# Patient Record
Sex: Male | Born: 1950 | Race: White | Hispanic: No | Marital: Married | Smoking: Current some day smoker
Health system: Southern US, Community
[De-identification: ages and names within clinical notes are randomized; demographics above are authoritative.]

## PROBLEM LIST (undated history)

## (undated) DIAGNOSIS — I519 Heart disease, unspecified: Secondary | ICD-10-CM

## (undated) DIAGNOSIS — Z955 Presence of coronary angioplasty implant and graft: Secondary | ICD-10-CM

## (undated) DIAGNOSIS — K635 Polyp of colon: Secondary | ICD-10-CM

## (undated) DIAGNOSIS — E785 Hyperlipidemia, unspecified: Secondary | ICD-10-CM

## (undated) HISTORY — DX: Polyp of colon: K63.5

## (undated) HISTORY — DX: Hyperlipidemia, unspecified: E78.5

## (undated) HISTORY — PX: COLONOSCOPY: SHX174

## (undated) HISTORY — DX: Heart disease, unspecified: I51.9

## (undated) HISTORY — PX: POLYPECTOMY: SHX149

---

## 1999-10-27 HISTORY — PX: SPINE SURGERY: SHX786

## 2000-06-15 ENCOUNTER — Encounter: Admission: RE | Admit: 2000-06-15 | Discharge: 2000-06-15 | Payer: Self-pay | Admitting: Orthopedic Surgery

## 2000-06-15 ENCOUNTER — Encounter: Payer: Self-pay | Admitting: Orthopedic Surgery

## 2000-06-30 ENCOUNTER — Encounter: Admission: RE | Admit: 2000-06-30 | Discharge: 2000-07-26 | Payer: Self-pay | Admitting: Neurology

## 2000-07-28 ENCOUNTER — Encounter: Admission: RE | Admit: 2000-07-28 | Discharge: 2000-07-28 | Payer: Self-pay | Admitting: Neurology

## 2000-07-28 ENCOUNTER — Encounter: Payer: Self-pay | Admitting: Neurology

## 2000-08-05 ENCOUNTER — Ambulatory Visit (HOSPITAL_COMMUNITY): Admission: RE | Admit: 2000-08-05 | Discharge: 2000-08-05 | Payer: Self-pay | Admitting: Neurological Surgery

## 2000-08-05 ENCOUNTER — Encounter: Payer: Self-pay | Admitting: Neurological Surgery

## 2002-10-26 HISTORY — PX: HERNIA REPAIR: SHX51

## 2002-10-26 HISTORY — PX: OTHER SURGICAL HISTORY: SHX169

## 2002-11-03 ENCOUNTER — Ambulatory Visit (HOSPITAL_COMMUNITY): Admission: RE | Admit: 2002-11-03 | Discharge: 2002-11-04 | Payer: Self-pay | Admitting: Cardiology

## 2002-11-03 ENCOUNTER — Encounter: Payer: Self-pay | Admitting: Cardiology

## 2003-05-02 ENCOUNTER — Encounter: Admission: RE | Admit: 2003-05-02 | Discharge: 2003-05-02 | Payer: Self-pay | Admitting: General Surgery

## 2003-05-02 ENCOUNTER — Ambulatory Visit (HOSPITAL_BASED_OUTPATIENT_CLINIC_OR_DEPARTMENT_OTHER): Admission: RE | Admit: 2003-05-02 | Discharge: 2003-05-02 | Payer: Self-pay | Admitting: General Surgery

## 2003-05-02 ENCOUNTER — Encounter: Payer: Self-pay | Admitting: General Surgery

## 2005-02-16 ENCOUNTER — Ambulatory Visit: Payer: Self-pay | Admitting: Cardiology

## 2005-08-20 ENCOUNTER — Ambulatory Visit: Payer: Self-pay | Admitting: Internal Medicine

## 2005-10-15 ENCOUNTER — Ambulatory Visit: Payer: Self-pay | Admitting: Internal Medicine

## 2006-02-10 ENCOUNTER — Ambulatory Visit: Payer: Self-pay | Admitting: Cardiology

## 2006-03-04 ENCOUNTER — Ambulatory Visit: Payer: Self-pay

## 2007-06-15 ENCOUNTER — Ambulatory Visit: Payer: Self-pay | Admitting: Cardiology

## 2007-06-15 LAB — CONVERTED CEMR LAB
ALT: 25 units/L (ref 0–53)
AST: 22 units/L (ref 0–37)
Albumin: 3.4 g/dL — ABNORMAL LOW (ref 3.5–5.2)
Alkaline Phosphatase: 52 units/L (ref 39–117)
Bilirubin, Direct: 0.2 mg/dL (ref 0.0–0.3)
Cholesterol: 117 mg/dL (ref 0–200)
HDL: 37.3 mg/dL — ABNORMAL LOW (ref >39.0)
LDL Cholesterol: 73 mg/dL (ref 0–99)
Total Bilirubin: 1.1 mg/dL (ref 0.3–1.2)
Total CHOL/HDL Ratio: 3.1
Total Protein: 6.4 g/dL (ref 6.0–8.3)
Triglycerides: 35 mg/dL (ref 0–149)
VLDL: 7 mg/dL (ref 0–40)

## 2007-07-05 ENCOUNTER — Ambulatory Visit: Payer: Self-pay | Admitting: Cardiology

## 2007-09-08 ENCOUNTER — Ambulatory Visit: Payer: Self-pay | Admitting: Cardiology

## 2007-09-08 LAB — CONVERTED CEMR LAB
ALT: 23 units/L (ref 0–53)
AST: 22 units/L (ref 0–37)
Albumin: 3.6 g/dL (ref 3.5–5.2)
Alkaline Phosphatase: 54 units/L (ref 39–117)
Bilirubin, Direct: 0.2 mg/dL (ref 0.0–0.3)
Cholesterol: 123 mg/dL (ref 0–200)
HDL: 39.7 mg/dL (ref >39.0)
LDL Cholesterol: 76 mg/dL (ref 0–99)
Total Bilirubin: 1.2 mg/dL (ref 0.3–1.2)
Total CHOL/HDL Ratio: 3.1
Total Protein: 6.8 g/dL (ref 6.0–8.3)
Triglycerides: 37 mg/dL (ref 0–149)
VLDL: 7 mg/dL (ref 0–40)

## 2008-01-16 ENCOUNTER — Ambulatory Visit: Payer: Self-pay | Admitting: Cardiology

## 2008-01-16 LAB — CONVERTED CEMR LAB
ALT: 21 units/L (ref 0–53)
AST: 22 units/L (ref 0–37)
Albumin: 3.4 g/dL — ABNORMAL LOW (ref 3.5–5.2)
Alkaline Phosphatase: 45 units/L (ref 39–117)
Bilirubin, Direct: 0.2 mg/dL (ref 0.0–0.3)
Cholesterol: 127 mg/dL (ref 0–200)
HDL: 37.1 mg/dL — ABNORMAL LOW (ref >39.0)
LDL Cholesterol: 81 mg/dL (ref 0–99)
Total Bilirubin: 1 mg/dL (ref 0.3–1.2)
Total CHOL/HDL Ratio: 3.4
Total Protein: 6.2 g/dL (ref 6.0–8.3)
Triglycerides: 43 mg/dL (ref 0–149)
VLDL: 9 mg/dL (ref 0–40)

## 2008-01-24 ENCOUNTER — Ambulatory Visit: Payer: Self-pay | Admitting: Cardiology

## 2008-01-26 ENCOUNTER — Ambulatory Visit: Payer: Self-pay | Admitting: Cardiology

## 2008-11-13 ENCOUNTER — Ambulatory Visit: Payer: Self-pay | Admitting: Cardiology

## 2009-02-04 ENCOUNTER — Ambulatory Visit: Payer: Self-pay | Admitting: Cardiology

## 2009-02-22 ENCOUNTER — Telehealth: Payer: Self-pay | Admitting: Cardiology

## 2009-02-22 LAB — CONVERTED CEMR LAB
AST: 28 units/L (ref 0–37)
Alkaline Phosphatase: 52 units/L (ref 39–117)
Bilirubin, Direct: 0.2 mg/dL (ref 0.0–0.3)
Total Bilirubin: 1.5 mg/dL — ABNORMAL HIGH (ref 0.3–1.2)
Total CHOL/HDL Ratio: 3
Triglycerides: 39 mg/dL (ref 0.0–149.0)

## 2009-06-05 ENCOUNTER — Encounter (INDEPENDENT_AMBULATORY_CARE_PROVIDER_SITE_OTHER): Payer: Self-pay | Admitting: *Deleted

## 2009-07-03 ENCOUNTER — Telehealth: Payer: Self-pay | Admitting: Cardiology

## 2009-07-04 DIAGNOSIS — E7800 Pure hypercholesterolemia, unspecified: Secondary | ICD-10-CM | POA: Insufficient documentation

## 2009-08-27 DIAGNOSIS — I2510 Atherosclerotic heart disease of native coronary artery without angina pectoris: Secondary | ICD-10-CM | POA: Insufficient documentation

## 2009-08-28 ENCOUNTER — Ambulatory Visit: Payer: Self-pay | Admitting: Cardiology

## 2009-08-28 ENCOUNTER — Ambulatory Visit: Payer: Self-pay

## 2009-11-15 ENCOUNTER — Telehealth: Payer: Self-pay | Admitting: Gastroenterology

## 2009-11-15 ENCOUNTER — Encounter: Payer: Self-pay | Admitting: Gastroenterology

## 2009-11-22 ENCOUNTER — Encounter (INDEPENDENT_AMBULATORY_CARE_PROVIDER_SITE_OTHER): Payer: Self-pay | Admitting: *Deleted

## 2009-11-25 ENCOUNTER — Ambulatory Visit: Payer: Self-pay | Admitting: Gastroenterology

## 2009-11-26 ENCOUNTER — Telehealth: Payer: Self-pay | Admitting: Gastroenterology

## 2010-02-17 ENCOUNTER — Ambulatory Visit: Payer: Self-pay | Admitting: Cardiology

## 2010-02-19 LAB — CONVERTED CEMR LAB
Bilirubin, Direct: 0.2 mg/dL (ref 0.0–0.3)
LDL Cholesterol: 72 mg/dL (ref 0–99)
Total Bilirubin: 1.1 mg/dL (ref 0.3–1.2)
Total CHOL/HDL Ratio: 3
VLDL: 14.6 mg/dL (ref 0.0–40.0)

## 2010-09-25 ENCOUNTER — Telehealth: Payer: Self-pay | Admitting: Cardiology

## 2010-11-25 NOTE — Letter (Signed)
Summary: Previsit letter  [facility]  [address] [place], [place] [address]   Phone: [phone]  Fax: [phone]       11/15/2009 MRN: [id number]  Jordan [name] [address] [place], [place]  [address]  Dear Mr. Jordan [name],  Welcome to the Gastroenterology Division at [facility].    You are scheduled to see a nurse for your pre-procedure visit on 11/25/09 at 8:30 a.m. on the 3rd floor at [facility], [address]  We ask that you try to arrive at our office 15 minutes prior to your appointment time to allow for check-in.  Your nurse visit will consist of discussing your medical and surgical history, your immediate family medical history, and your medications.    Please bring a complete list of all your medications or, if you prefer, bring the medication bottles and we will list them.  We will need to be aware of both prescribed and over the counter drugs.  We will need to know exact dosage information as well.  If you are on blood thinners (Coumadin, Plavix, Aggrenox, Ticlid, etc.) please call our office today/prior to your appointment, as we need to consult with your physician about holding your medication.   Please be prepared to read and sign documents such as consent forms, a financial agreement, and acknowledgement forms.  If necessary, and with your consent, a friend or relative is welcome to sit-in on the nurse visit with you.  Please bring your insurance card so that we may make a copy of it.  If your insurance requires a referral to see a specialist, please bring your referral form from your primary care physician.  No co-pay is required for this nurse visit.     If you cannot keep your appointment, please call [phone] to cancel or reschedule prior to your appointment date.  This allows [place] the opportunity to schedule an appointment for another patient in need of care.    Thank you for choosing [facility] for your medical  needs.  We appreciate the opportunity to care for you.  Please visit [place] at our website  to learn more about our practice.                     Sincerely.                                                                                                                   The Gastroenterology Division

## 2010-11-25 NOTE — Progress Notes (Signed)
Summary: refill meds  Phone Note Refill Request Call back at Home Phone [phone] Message from:  Patient on September 25, 2010 1:14 PM  Refills Requested: Medication #1:  CRESTOR 40 MG TABS Take 1 tablet by mouth [facility]time [facility] [address]    Method Requested: Fax to Local Pharmacy Initial call taken by: Lorne Skeens,  September 25, 2010 1:14 PM  Follow-up for Phone Call        Rx faxed to pharmacy. Vikki Ports  September 25, 2010 2:24 PM     Prescriptions: CRESTOR 40 MG TABS (ROSUVASTATIN CALCIUM) Take 1 tablet by mouth at bedtime  #90 x 0   Entered by:   Vikki Ports   Authorized by:   Ronaldo Miyamoto, MD, [facility]   Signed by:   Vikki Ports on 09/25/2010   Method used:   [facility] ...       [facility] [phone]* (retail)       [address]       [place], [place]  [address]       Ph: [phone] or [phone]       Fax: [phone]   RxID:   [id number]

## 2010-11-25 NOTE — Progress Notes (Signed)
Summary: [facility]  Phone Note Outgoing Call   Call placed by: Hortense Ramal CMA Duncan Dull),  November 15, 2009 12:03 PM Call placed to: Patient Summary of Call: Called patient to advise him that it is time for his recall colonoscopy. He has scheduled a date and time for both a previsit and colonoscopy. Initial call taken by: Hortense Ramal CMA Duncan Dull),  November 15, 2009 12:04 PM

## 2010-11-25 NOTE — Letter (Signed)
Summary: [facility] Instructions  [facility]  [address] [place], [place] [address]   Phone: [phone]  Fax: [phone]       Jordan [name]    [birth year]    MRN: [id number]        Procedure Day /Date:  Tuesday 12/03/09     Arrival Time:  3:00pm     Procedure Time:  4:00pm     Location of Procedure:                    _X _  [facility] (4th Floor)                        PREPARATION FOR COLONOSCOPY WITH MOVIPREP   Starting 5 days prior to your procedure   Thursday 02/03   do not eat nuts, seeds, popcorn, corn, beans, peas,  salads, or any raw vegetables.  Do not take any fiber supplements (e.g. Metamucil, Citrucel, and Benefiber).  THE DAY BEFORE YOUR PROCEDURE         DATE:  02/07   DAY: Monday  1.  Drink clear liquids the entire day-NO SOLID FOOD  2.  Do not drink anything colored red or purple.  Avoid juices with pulp.  No orange juice.  3.  Drink at least 64 oz. (8 glasses) of fluid/clear liquids during the day to prevent dehydration and help the prep work efficiently.  CLEAR LIQUIDS INCLUDE: Water Jello Ice Popsicles Tea (sugar ok, no milk/cream) Powdered fruit flavored drinks Coffee (sugar ok, no milk/cream) Gatorade Juice: apple, white grape, white cranberry  Lemonade Clear bullion, consomm, broth Carbonated beverages (any kind) Strained chicken noodle soup Hard Candy                             4.  In the morning, mix first dose of MoviPrep solution:    Empty 1 Pouch A and 1 Pouch B into the disposable container    Add lukewarm drinking water to the top line of the container. Mix to dissolve    Refrigerate (mixed solution should be used within 24 hrs)  5.  Begin drinking the prep at 5:00 p.m. The MoviPrep container is divided by 4 marks.   Every 15 minutes drink the solution down to the next mark (approximately 8 oz) until the full liter is complete.   6.  Follow completed prep with 16 oz of clear liquid of your  choice (Nothing red or purple).  Continue to drink clear liquids until bedtime.  7.  Before going to bed, mix second dose of MoviPrep solution:    Empty 1 Pouch A and 1 Pouch B into the disposable container    Add lukewarm drinking water to the top line of the container. Mix to dissolve    Refrigerate  THE DAY OF YOUR PROCEDURE      DATE:  02/08  DAY:  Tuesday  Beginning at  11:00 a.m. (5 hours before procedure):         1. Every 15 minutes, drink the solution down to the next mark (approx 8 oz) until the full liter is complete.  2. Follow completed prep with 16 oz. of clear liquid of your choice.    3. You may drink clear liquids until  2:00pm  (2 HOURS BEFORE PROCEDURE).   MEDICATION INSTRUCTIONS  Unless otherwise instructed, you should take regular prescription medications with a  small sip of water   as early as possible the morning of your procedure.           OTHER INSTRUCTIONS  You will need a responsible adult at least 60 years of age to accompany you and drive you home.   This person must remain in the waiting room during your procedure.  Wear loose fitting clothing that is easily removed.  Leave jewelry and other valuables at home.  However, you may wish to bring a book to read or  an iPod/MP3 player to listen to music as you wait for your procedure to start.  Remove all body piercing jewelry and leave at home.  Total time from sign-in until discharge is approximately 2-3 hours.  You should go home directly after your procedure and rest.  You can resume normal activities the  day after your procedure.  The day of your procedure you should not:   Drive   Make legal decisions   Operate machinery   Drink alcohol   Return to work  You will receive specific instructions about eating, activities and medications before you leave.    The above instructions have been reviewed and explained to me by   Wyona Almas RN  November 25, 2009 8:53  AM     I fully understand and can verbalize these instructions _____________________________ Date _________

## 2010-11-25 NOTE — Miscellaneous (Signed)
Summary: [facility] Previsit/prep  Clinical Lists Changes  Medications: Added new medication of MOVIPREP 100 GM  SOLR (PEG-KCL-NACL-NASULF-NA ASC-C) As per prep instructions. - Signed Rx of MOVIPREP 100 GM  SOLR (PEG-KCL-NACL-NASULF-NA ASC-C) As per prep instructions.;  #1 x 0;  Signed;  Entered by: Wyona Almas RN;  Authorized by: Meryl Dare MD [facility];  Method used: [facility] [address] [phone]*, [address] [place], [place], [place]  [address], Ph: [phone] or [phone], Fax: [phone] Observations: Added new observation of NKA: T (11/25/2009 8:24)    Prescriptions: MOVIPREP 100 GM  SOLR (PEG-KCL-NACL-NASULF-NA ASC-C) As per prep instructions.  #1 x 0   Entered by:   Wyona Almas RN   Authorized by:   Meryl Dare MD [facility]   Signed by:   Wyona Almas RN on 11/25/2009   Method used:   Electronically to        [facility] [address] [phone]* (retail)       [address]       [place], [place]  [address]       Ph: [phone] or [phone]       Fax: [phone]   RxID:   [id number]

## 2010-11-25 NOTE — Progress Notes (Signed)
Summary: Colonoscopy  Phone Note Outgoing Call   Call placed by: Ashok Cordia RN,  November 26, 2009 3:41 PM Summary of Call: Pt talked with Dr. Jarold Motto.. Pt is sch with Dr. Russella Dar for a direct colon.  PT would like for D.r Jarold Motto to do his proc.  This is OK with Dr. Jarold Motto.  Pt can be reached at [phone].  Pt has already had his previsit. Initial call taken by: Ashok Cordia RN,  November 26, 2009 3:42 PM  Follow-up for Phone Call        Pt asks about how the colon charges will be done.  If billed as diagnostic then insurance will pay 80% after pt meets a $2500 deductable.  IF billed as screening/prevenative care insurance will pay 100%.  Pt states he had polyps in the past and is asking about how this will be billed.  He wants to find out about this before colon is rescheduled with Dr. Jarold Motto. Follow-up by: Ashok Cordia RN,  November 27, 2009 9:23 AM  Additional Follow-up for Phone Call Additional follow up Details #1::        Talked with pt.  He is going to wait a few months and see if he meets his decuctible before scheduling colon.  Unsure of how [employer] will pay.  Encouraged pt not to delay too long due to history of colon polyps. Additional Follow-up by: Ashok Cordia RN,  November 28, 2009 3:44 PM

## 2011-01-01 ENCOUNTER — Telehealth: Payer: Self-pay | Admitting: Cardiology

## 2011-01-06 NOTE — Progress Notes (Signed)
Summary: refill request Crestor 40mg   Phone Note Refill Request Message from:  Patient on January 01, 2011 8:16 AM  Refills Requested: Medication #1:  CRESTOR 40 MG TABS Take 1 tablet by mouth at bedtime [facility] [address]   Method Requested: Telephone to Pharmacy Initial call taken by: Glynda Jaeger,  January 01, 2011 8:16 AM    Prescriptions: CRESTOR 40 MG TABS (ROSUVASTATIN CALCIUM) Take 1 tablet by mouth at bedtime  #90 x 3   Entered by:   Celestia Khat, CMA   Authorized by:   Ronaldo Miyamoto, MD, [facility]   Signed by:   Celestia Khat, CMA on 01/01/2011   Method used:   Electronically to        [facility] [address] [phone]* (retail)       [address]       [place], [place]  [address]       Ph: [phone] or [phone]       Fax: [phone]   RxID:   [id number]

## 2011-03-10 NOTE — Assessment & Plan Note (Signed)
[facility]                            CARDIOLOGY OFFICE NOTE   NAME:Saunders, Jordan [name]                    MRN:          [id number]  DATE:11/13/2008                            DOB:          11-06-1950    Jordan [name] is in for followup visit.  He really is doing quite well.  He  denies any chest pain.  He walks on the treadmill 3-4 times per day.  His last lipid profile was in April 2009.  He has not had any obvious  side effects from high-dose Crestor.   CURRENT MEDICATIONS:  1. Folic acid 400 mcg daily.  2. Lysine 1 daily.  3. Multivitamin daily.  4. Aspirin 162 mg daily.  5. Crestor 40 mg daily.   PHYSICAL EXAMINATION:  GENERAL:  He is alert and oriented in no  distress.  VITAL SIGNS:  Blood pressure is 128/64, the pulse is 58, and the weight  is 199 pounds.  NECK:  His jugular veins are not distended.  LUNGS:  The lung fields are clear to auscultation and percussion.  CARDIAC:  The PMI is nondisplaced.  There is a normal first and second  heart sound without murmurs, rubs, or gallops.  ABDOMEN:  Soft.  The abdominal aorta is palpable due to thin size, but  importantly it does not feel expanded.  EXTREMITIES:  No edema.   The electrocardiogram demonstrates normal sinus rhythm with sinus  bradycardia, otherwise within normal limits.   IMPRESSION:  1. Coronary artery disease, status post percutaneous stenting of the      left anterior descending artery on November 03, 2002 using a 2.5 x 23      Cypher drug-eluting stent.  2. Hypercholesterolemia, on statin therapy with last LDL cholesterol      of 67.   PLAN:  1. Continue current medical regimen.  2. Lipid and liver profile in the next few months.  3. Exercise tolerance test towards the end of the year.  4. Return to clinic in 1 year.     Arturo Morton. Riley Kill, MD, [facility]  Electronically Signed    TDS/MedQ  DD: 11/13/2008  DT: 11/14/2008  Job #: [id number]

## 2011-03-10 NOTE — Assessment & Plan Note (Signed)
[facility]                            CARDIOLOGY OFFICE NOTE   NAME:Jordan [name], Jordan [name]                    MRN:          [id number]  DATE:07/05/2007                            DOB:          29-Dec-1950    Mr. Esterly is in for a followup visit. In general he is doing well. He  is able to exercise without difficultly. He denies any chest pain or  significant shortness of breath. We had him come in today to review his  lipid profile. His most recent lipid profile reveals an LDL of 73, with  an HDL of 37, and a total cholesterol of 118. This is fairly similar to  his previous study. He currently is on Vytorin 10/40 mg daily.   CURRENT MEDICATIONS:  Include:  1. Folic acid 400 mcg daily.  2. Lysine 1 daily.  3. Vytorin 10/40 1 tablet daily.  4. Multivitamin daily.  5. Aspirin 162 mg daily.   PHYSICAL EXAMINATION:  He is a tall, lean gentleman in no acute  distress. The weight is 196 pounds, blood pressure 96/64, and the pulse  is 64 and regular.  There are no carotid bruits.  LUNG FIELDS: Clear to auscultation and percussion.  The PMI is nondisplaced and there is no significant murmur, rub, or  gallop.  ABDOMEN: Soft and there is no obvious hepatosplenomegaly and no obvious  widened pulsation.   The electrocardiogram demonstrates normal sinus rhythm and is completely  within normal limits.   IMPRESSION:  1. Coronary artery disease with prior percutaneous stenting of the      left anterior descending artery using a drug-eluting platform in      January of 2004.  2. Hypercholesterolemia with a most recent LDL of 73.   DISPOSITION:  1. Continued medical therapy.  2. At the present time, he will continue and finish his Vytorin. This      will be followed by initiation of Crestor at 20 mg daily. The lipid      and liver profile will be obtained in 6 weeks. Our goal will be an      LDL in the 60s.   We have discussed these options. And I have  reviewed with him the data  regarding both Vytorin, including clinical trials, and Crestor,  including the results of the ASTEROID study. With  this data, given the unknowns related to Vytorin and the role of Zetia  we have elected to go an entirely statin route, and our goal will be an  LDL in the 60s.     Arturo Morton. Riley Kill, MD, [facility]  Electronically Signed    TDS/MedQ  DD: 07/05/2007  DT: 07/06/2007  Job #: [id number]   cc:   Rosalyn Gess. Norins, MD

## 2011-03-10 NOTE — Assessment & Plan Note (Signed)
[facility]                            CARDIOLOGY OFFICE NOTE   NAME:[name], [name]                    MRN:          [id number]  DATE:01/24/2008                            DOB:          12/14/1950    HISTORY:  Jordan [name] is in for followup.  He is doing really quite well.  He is not having any recurrent problems.  He denies any chest pain or  progressive shortness of breath.  He returned for his lipid profile on  Crestor 40 mg which revealed an LDL of 81, HDL of 37, total cholesterol  of 127.  His liver function studies were unremarkable.  His SGPT was  normal.  Importantly, the patient has had no obvious side effects from  medication.   MEDICATIONS:  1. His medications include folic acid 400 mg daily.  2. Lysin 500 mg daily.  3. Multivitamin daily.  4. Aspirin 162 mg daily.  5. Crestor 40 mg daily.   PHYSICAL EXAMINATION:  VITAL SIGNS:  On physical the blood pressure is  123/65, the pulse is 62.  LUNGS:  The lung fields are clear.  CARDIOVASCULAR:  The cardiac rhythm is regular.  No significant murmur  is noted.  There are no carotid bruits.   His electrocardiogram today reveals normal sinus rhythm, essentially  within normal limits.   IMPRESSION:  1. Known coronary artery disease status post percutaneous stenting of      the left anterior descending artery in January 2004 with residual      disease involving the diagonal.  2. Hypercholesterolemia on aggressive lipid lowering therapy with      continued LDL of greater than 70.   RECOMMENDATIONS:  1. A LipoMed profile will be obtained.  2. Based on this information we may elect to add an HDL raising drug      and/or continue our current efforts depending upon results.  3. Follow up in 6 months.   ADDENDUM:  The patient has had small cysts taken off of his back.    Arturo Morton. Riley Kill, MD, [facility]  Electronically Signed   TDS/MedQ  DD: 01/25/2008  DT: 01/25/2008  Job #: [id number]   cc:    Rosalyn Gess. Norins, MD

## 2011-03-13 NOTE — Assessment & Plan Note (Signed)
[facility]                            CARDIOLOGY OFFICE NOTE   NAME:[name], Jordan [name]                    MRN:          [id number]  DATE:03/07/2008                            DOB:          Aug 16, 1951    I called Jordan [name] today regarding his NMR LipoProfile, we discussed the  findings in some detail; now on Crestor 40, he has an LDLC of 67.  We  are pleased with those overall numbers.  He has tolerated this well, and  not had any major problems with the medication.  His liver functions  were entirely normal on Crestor 20 and on simvastatin 40/10, Vytorin; we  will just suggest rechecking him in approximately 6 months.     Arturo Morton. Riley Kill, MD, [facility]  Electronically Signed    TDS/MedQ  DD: 03/11/2008  DT: 03/11/2008  Job #: [id number]

## 2011-03-13 NOTE — Op Note (Signed)
[place]  Patient:    Jordan [name], Jordan [name]                       MRN: [id number] Proc. Date: 08/05/00 Adm. Date:  [id number] Disc. 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  PROCEDURE:  Lumbar microendoscopic diskectomy, L5-S1 right, with Met-RX system and operating microscope with microdissection technique.  SURGEON:  Stefani Dama, M.D.  FIRST ASSISTANT:  Hewitt Shorts, M.D.  ANESTHESIA:  General endotracheal.  INDICATION:  The patient is a 60 year old individual who has had significant back and right lower extremity pain.  He has a large extruded fragment of disk at the L5-S1 level that is central and off to the right side.  He has been advised regarding surgery.  DESCRIPTION OF PROCEDURE:  The patient was brought to the operating room supine on a stretcher.  After a smooth induction of general endotracheal anesthesia, he was turned prone.  The back was shaved, prepped with Duraprep, and draped in a sterile fashion.  After localizing the paramedian area of L5-S1 with fluoroscopic imaging, an area 1 cm lateral to the midline was infiltrated with lidocaine with epinephrine 1%.  A 15 mm incision was made in this area and taken down through the fascia.  A K-wire was then placed on the laminar arch of the L5 vertebra on the right side, again using fluoroscopic localization.  Then the first dilator was placed over this.  The area was wanded free and clear, identifying the laminar arch with this dissection technique.  The second dilator was placed over this, and the area was progressively dilated until an 18 mm cannula measuring 5 cm in depth could be placed down to the level of the interlaminar space at L5-S1.   This system was locked to the table, and then the microscope was brought into the field.  The laminar arch up the mesial wall of L5-S1 was identified on the right side. Laminotomy was then created using the Midas Rex and an A2 bur.  The yellow ligament was then taken off using a combination of 2 and 3 mm Kerrison punches.  Once this was opened, the common neural tube was identified and the L5 nerve root was noted to be bulged inferiorly and ventrally.  A final localizing film was taken with the [facility] IV probe just cephalad to the disk herniation.  Then with microdissection technique, the epidural veins were dissected free and the S1 nerve root was retracted medially on the right side. The dissection was carried out such that the nerve root was well protected. The disk was noted to be subligamentous in its position, and the ligament was incised in a superior caudal direction, and through this a significant quantity of disk under considerable pressure was released.  The disk space was then identified.  It was noted to be fairly tight, but there was some loose disk material in the hole, through which a Penfield IV dissector could be passed.  This released some disk material from the edges, and then it was felt that the disk space would need to be cleared completely of a significant quantity of degenerated disk material,  which was found there.  The area was dissected free both in the medial and lateral position using a combination of curettes, rongeurs.  Once the disk space was evacuated of its material, the area was then checked for hemostasis.  The S1 nerve root was noted to be flat and clear across the disk space.  Hemostasis was achieved, and then the 5 cm x 18 mm endoscope was removed.  The fascia was closed under microscopic guidance with 3-0 Vicryl.  Vicryl 3-0 was used subcuticularly in the skin.  A clear plastic dressing was placed on the skin.  The patient tolerated the  procedure well.  Blood loss was estimated at less than 50 cc. DD:  08/05/00 TD:  08/07/00 Job: [id number]

## 2011-03-13 NOTE — Cardiovascular Report (Signed)
NAME:  Jordan [name], Jordan [name]                          ACCOUNT NO.:  [id number]   MEDICAL RECORD NO.:  [id number]                   PATIENT TYPE:  OIB   LOCATION:  2899                                 FACILITY:  [facility]   PHYSICIAN:  Arturo Morton. Riley Kill, M.D. [facility]         DATE OF BIRTH:  02/04/1951   DATE OF PROCEDURE:  11/03/2002  DATE OF DISCHARGE:                              CARDIAC CATHETERIZATION   INDICATIONS:  The patient is a delightful 60 year old gentleman, well known  to me.  He has previously had a strong family history of coronary artery  disease.  He has had an elevated LDL cholesterol.  In July of this year he  had a mildly abnormal Cardiolite with a questionable apical defect, and some  ST depression, but a very high level of exercise.  We discussed the  possibility of cardiac catheterization versus observation and the patient  was considering his options.  In the last month, he had developed a mild  substernal chest discomfort with left arm numbness associated with exercise  and activity.  With this and a decline in exercise tolerance, it was our  feeling that further evaluation was warranted.   PROCEDURES:  1. Left heart catheterization.  2. Selective coronary arteriography.  3. Selective left ventriculography.  4. Percutaneous coronary intervention of the left anterior descending artery     using a 2.5 x 23 Cypher stent post-dilated to 3 mm.   DESCRIPTION OF PROCEDURE:  The patient was brought to the catheterization  lab and prepped and draped in the usual fashion.  Through an anterior puncture the right femoral artery was easily entered.  A  6 French sheath was placed.  The patient had a mildly lower blood pressure  perhaps related to a small amount of volume depletion, and intravenous  fluids were administered.  Ventriculography was performed in the RAO  projection.  Following this, coronary arteriography was performed with  standard Judkins catheters.  The patient was  noted to have a subtotal  occlusion of the left anterior descending artery.  There was some faint  collateralization of the distal LAD.  This covered a second diagonal branch.  I carefully reviewed the options with the patient and it was our feeling  that the patient should undergo percutaneous coronary intervention of the  left anterior descending artery.  Preparations were then made for  percutaneous intervention. A JL 3.5 guiding catheter was then utilized.  Heparin and Integrilin were given according to protocol and an ACT  documented.  A Hi-Torque Floppy wire was passed down the LAD, and the LAD  was dilated using a 2.5 x 20 mm CrossSail balloon.  There was some moderate  improvement in the appearance of the artery with relief of the dumbbell in  the middle of the balloon.  Following this, we carefully measured the area  and it was our feeling that we should try to cross this area with  a 23 mm x  2.5 mm Cypher stent.  This entailed the origin of the second diagonal  branch, which itself had an 80% ostial stenosis.  We elected to treat the  parent vessel and try to conservatively manage the side branch.  There was  no dramatic change in its appearance.  The parent vessel was then stented  using the 2.5 mm stent, and this was taken up to about 14 atmospheres.  We  then took a 3.0 x 8 mm length Quantum Maverick balloon. At the top portion  of the stent above the takeoff of the diagonal branch, inflations were  performed up to 12 atmospheres to fully deploy the stent. There was marked  expansion of the stent. We then placed the post-dilatation balloon at the  distal end of the stent, and this was taken up to about 6 or 7 atmospheres  with a noncompliant balloon to better deploy the balloon, although the  distal vessel was somewhat smaller and it was felt that 2.75 mm would be  more than adequate at this location. The patient did develop some mild spasm  both in the left main as well as the  vessel beyond the stented location and  100 mcg of intracoronary nitroglycerin was given, which relieved this  stenosis.  Overall, the patient tolerated the procedure well.  Following  final views, all catheters were subsequently removed and the femoral sheath  sewn into place.  For back pain, the patient was given 2 mg of intravenous  Nubain with moderate relief of the discomfort after coming off the table.  Intravenous fluids were continued and he was taken to the holding area in  satisfactory clinical condition.   HEMODYNAMIC DATA:  Initial central aortic pressure prior to fluids was 92/58 with a mean of 73.  LV pressure was 88/11.  There was no gradient on pullback across the aortic  valve.   ANGIOGRAPHIC DATA:  1. Ventriculography was performed in the RAO projection.  Overall systolic     function was normal.  No segmental wall motion abnormalities were     identified.  Because of ventricular ectopy, a ejection fraction was not     calculated.  It would be estimated at minimum of 55-60%.  2. The left main coronary artery is not significantly narrowed. There was     some mild spasm during the procedure.  However, on most views there     appears to be 20-30% diffuse narrowing of the left main with a minimum     lumen diameter of about 3 mm.  3. The left anterior descending artery courses to the apex. The left     anterior descending artery has a fair amount of ectasia.  In the proximal     portion of the mid vessel, there is a high-grade stenosis of     approximately 95%, just overlaps a tiny diagonal branch and is after the     first major septal.  Just beyond the subtotal area there is a continued     disease of about 50%  that extends down beyond the origin of the second     diagonal.  The second diagonal itself is a small to moderate sized vessel     that has about an 80% ostial stenosis.  There is a third diagonal branch    which is tiny and has about 90% proximal narrowing.   There is a segmental     stenosis of 50-60% just proximal  to the apical tip and then at the apical     tip, the vessel divides after about a 80-90% stenosis, which involves a     very small portion of the vessel.  Following the stenting procedure, the     LAD in the area of 90-95% narrowing is reduced to essentially 0% residual     luminal narrowing with the Cordis Cypher drug-eluting stent.  The second     diagonal remains narrow with about 80% narrowing and was not dilated in     order to protect the stent result in the parent vessel.  TIMI-3 flow     distally was noted.  There is not a significant change distally in this     particular vessel.  4. The circumflex provides a tiny first marginal which is insignificant.     There is probably about 30% narrowing near the very proximal edge of the     circumflex vessel.  There is a second stenosis and about 30%.  Following     this, there is a marginal branch which is relatively small and then the     vessel terminates as a bifurcating marginal branch distally in the     posterolateral segment.  5. The right coronary artery demonstrates about 20% proximal narrowing an     then about 20% mid narrowing.  Distally, there is mild luminal     irregularity.  There is an acute marginal branch which appears to provide     collateralization of the apical portion of the LAD, although the     collaterals are faint.   CONCLUSIONS:  1. 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  2. An 80% stenosis of the proximal second diagonal branch.  3. Other luminal irregularities as noted above including mild disease of the     left main, the distal left anterior descending, the proximal circumflex     and proximal and mid right coronaries.   DISPOSITION:  The patient had placement of a Cypher drug-eluting stent.  He  will need aspirin and  Plavix for approximately three months.  He should not  undergo MRI scanning for approximately two months. We will need to discuss  the appropriate approach to prevention.  Given his marked positive family  history and moderate progression  over the past 6-7 months clinically, consideration might be given to the  recent results of the REVERSAL trial and thought given to potential of high-  dose atorvastatin therapy.  I will discuss the potential options with the  patient.                                                     Arturo Morton. Riley Kill, M.D. [facility]    TDS/MEDQ  D:  11/03/2002  T:  11/04/2002  Job:  [id number]   cc:   CV Laboratory   Rosalyn Gess. Norins, M.D. [facility]

## 2011-03-13 NOTE — Op Note (Signed)
NAME:  Jordan [name], Jordan [name]                          ACCOUNT NO.:  [id number]   MEDICAL RECORD NO.:  [id number]                   PATIENT TYPE:  AMB   LOCATION:  DSC                                  FACILITY:  [facility]   PHYSICIAN:  Sharlet Salina T. Hoxworth, M.D.          DATE OF BIRTH:  Apr 19, 1951   DATE OF PROCEDURE:  05/02/2003  DATE OF DISCHARGE:                                 OPERATIVE REPORT   PREOPERATIVE DIAGNOSIS:  Right inguinal hernia.   POSTOPERATIVE DIAGNOSIS:  Right inguinal hernia.   OPERATION PERFORMED:  Repair of right inguinal hernia with mesh.   SURGEON:  Lorne Skeens. Hoxworth, M.D.   ANESTHESIA:  Local with IV sedation.   INDICATIONS FOR PROCEDURE:  The patient is a 60 year old white male who  presents with a gradually enlarging symptomatic right inguinal hernia  confirmed by exam.  Options for repair were discussed and it was elected to  proceed with open repair with mesh under local anesthesia with sedation.  The nature of the procedure, its indications and risks of bleeding,  infection, and recurrence were discussed and understood.  He is now brought  to the operating room for this procedure.   DESCRIPTION OF PROCEDURE:  The patient was brought to the operating room and  placed in supine position on the operating table.  IV sedation was  administered.  He was given preoperative antibiotics.  The right groin was  sterilely prepped and draped.  Local anesthesia was used to block the  ilioinguinal nerve and infiltrate the area of incision.  An oblique incision  was made in the right groin and dissection was carried down  to the  subcutaneous tissue.  Crossing veins were doubly clamped, divided and  ligated with 3-0 Vicryl.  The external oblique was identified, anesthetized  and divided along the lines of its fibers to the external ring.  The  ilioinguinal nerve was identified, dissected free and protected during the  remainder of the procedure.  The cord was dissected  up off the floor at the  pubic tubercle.  Cremasteric fibers were divided up the internal ring  completely freeing the cord.  There was a moderate sized direct defect.  The  attenuated transversalis fascia was dissected free from the cord and  surrounding structures.  There was no indirect sac. Initially, the  attenuated transversalis fascia was imbricated with a running 2-0 Prolene  suture to hold the defect reduced.  A piece of Parietex polyester mesh was  then trimmed to size to fit the floor for the inguinal canal with tails  around the cord at the internal ring.  It was sutured initially to the pubic  tubercle and then to the inguinal ligament and iliopubic tract working  medial to lateral with a running 2-0 Prolene.  Medially the mesh was sutured  to the edge of the rectus sheath with interrupted 2-0 Prolene.  The tails  were then tacked  together lateral to the cord creating a new internal ring  snug to the fingertip.  This provided nice wide coverage to the direct and  indirect spaces.  The cord and ilioinguinal nerves were returned to their  anatomic position.  The external oblique was closed over this with running 3-  0 Vicryl.  Scarpa's fascia was closed with  running 3-0 Vicryl and the skin was closed with running subcuticular 4-0  Monocryl and Steri-Strips.  Sponge, needle and instrument counts were  correct.  A dry sterile dressing was applied.  The patient was then  transferred to the recovery room in good condition.                                                Lorne Skeens. Hoxworth, M.D.    Tory Emerald  D:  05/02/2003  T:  05/02/2003  Job:  [id number]

## 2011-03-13 NOTE — Discharge Summary (Signed)
NAME:  Jordan [name], Jordan [name]                          ACCOUNT NO.:  [id number]   MEDICAL RECORD NO.:  [id number]                   PATIENT TYPE:  OIB   LOCATION:  6533                                 FACILITY:  [facility]   PHYSICIAN:  Arturo Morton. Riley Kill, M.D. [facility]         DATE OF BIRTH:  1951/08/13   DATE OF ADMISSION:  11/03/2002  DATE OF DISCHARGE:                                 DISCHARGE SUMMARY   CHIEF COMPLAINT:  Mild tightness with exercise.   HISTORY OF PRESENT ILLNESS:  The patient is a 60 year-old who is well known  to me.  He has a strong family history of coronary artery disease as well as  hyperlipidemia.  In the summer of 2003, the patient had an exercise  tolerance test with excellent exercise tolerance, but some ST depression at  peak exercise and a small apical defect.  He has recently noted over the  past months some declining exercise tolerance.  Based on the family history,  declining exercise tolerance and a change in abnormal treadmill study in  July of last year it was the feeling that the patient should under cardiac  catheterization.   PAST MEDICAL HISTORY:  1. History of hypercholesterolemia.  2. Back problems.  3. Elevated homocysteine.   FAMILY HISTORY:  Remarkable for a brother who had a MI at age 69 associated  with ventricular tachycardia.  He received TPA and a stent.  Father died at  75 of a myocardial infarction.  His mother is alive with a history of mitral  valve prolapse and paroxysmal atrial tachycardia.  He has two uncles, ages  61 and 70, who had MI's and maternal uncles, ages 21 with bypass, 14 with  bypass and 70 with bypass.   SOCIAL HISTORY:  The patient is a non-smoker and the married father of  three.   PHYSICAL EXAMINATION ON ADMISSION:  GENERAL:  He is an alert and oriented  male in no acute distress.  VITAL SIGNS:  Blood pressure 108/49, pulse 71, temperature 98.4,  respirations were unlabored.  LUNGS:  Clear to auscultation and  percussion.  CARDIAC:  The rhythm was regular.  ABDOMEN:  Femoral pulses were intact without bruits.  EXTREMITIES:  No edema.   LABORATORY DATA:  Sodium 137, potassium 3.7, chloride 102, C02 26, BUN 16,  creatinine 0.9.  Prothrombin time 13.4.  White count 5.5, hemoglobin 16.2,  platelet count 136,000.   Chest x-ray was read as the lungs being clear.  The heart and mediastinal  structures were normal.   Pre-procedural EKG reveals normal sinus rhythm with nonspecific T wave  abnormality.   HOSPITAL COURSE:  The patient was admitted to undergo diagnostic cardiac  catheterization.  Please see the cardiac catheterization report for details.  To briefly summarize, the patient had a high grade stenosis of the left  anterior descending.  There was mild irregularity of the right coronary  artery with some  collateralization of the distal LAD.  There was also  significant disease of a small to moderate size second diagonal branch  involving the ostium.  There was also an apical stenosis of approximately  80%.  There was mild irregularity of the left main, proximal circumflex and  proximal to mid circumflex as noted in the above report.  Overall left  ventricular function was normal.  Based upon the findings, it was my feeling  that the patient should undergo percutaneous coronary intervention.  A 2.5 x  23 mm Cypher stent was placed with adjunctive glycoprotein inhibition using  eptifibatide and anticoagulation using heparin. Plavix 300 mg was  administered in the cardiac catheterization laboratory.  The vessel was  stented and post dilated using a 3 mm balloon.  There was an excellent  angiographic appearance.  There was a diagonal branch that came off at the  distal end of the lesion and this diagonal branch was not dilated so as to  avoid compromise of the parent vessel and the stent covering this area.  The  patient tolerated the procedure well.  The sheath was eventually removed and  the  patient underwent bedrest for approximately six hours.  Intravenous  fluid was administered.  The patient was given low doses of Nubain for back  pain.  He was up and ambulatory and alert at the time of discharge.  We  spent more than an hour discussing with him various aspects of the coronary  arteriographic findings, the treatment plan and the long term strategies.  Mild thrombocytopenia was noted and the platelet count fell to approximately  114,000 in the morning.   All stent precautions were reviewed including the need for antibiotics for  the first two to three months, the need to avoid MRI scanning and the need  for daily aspirin and Plavix to prevent stent thrombosis.   FOLLOW UP:  1. The patient will return in follow-up on Friday, January 23 at 2:30 p.m.     with Dian Queen and myself.  2. In addition, the patient will undergo a CBC on Tuesday.   DISCHARGE MEDICATIONS:  1. Plavix 75 mg daily times six to nine months.  2. Enteric coated aspirin 325 mg daily for two weeks, then 81 mg daily.  3. Lipitor 10 mg daily.  4. Folic acid daily.  5. Nitroglycerin 0.4 mg as needed.                                                Arturo Morton. Riley Kill, M.D. [facility]    TDS/MEDQ  D:  11/04/2002  T:  11/05/2002  Job:  [id number]   cc:   Rosalyn Gess. Norins, M.D. [facility]

## 2011-12-22 ENCOUNTER — Other Ambulatory Visit: Payer: Self-pay | Admitting: Cardiology

## 2011-12-22 NOTE — Telephone Encounter (Signed)
Patient needs to make an appointment.

## 2011-12-24 ENCOUNTER — Other Ambulatory Visit: Payer: Self-pay

## 2011-12-24 DIAGNOSIS — E7800 Pure hypercholesterolemia, unspecified: Secondary | ICD-10-CM

## 2012-01-20 ENCOUNTER — Encounter: Payer: Self-pay | Admitting: *Deleted

## 2012-02-01 ENCOUNTER — Other Ambulatory Visit: Payer: Self-pay

## 2012-02-02 ENCOUNTER — Other Ambulatory Visit (INDEPENDENT_AMBULATORY_CARE_PROVIDER_SITE_OTHER): Payer: 59

## 2012-02-02 DIAGNOSIS — E7800 Pure hypercholesterolemia, unspecified: Secondary | ICD-10-CM

## 2012-02-02 LAB — HEPATIC FUNCTION PANEL
ALT: 25 U/L (ref 0–53)
Alkaline Phosphatase: 54 U/L (ref 39–117)
Bilirubin, Direct: 0.1 mg/dL (ref 0.0–0.3)
Total Protein: 6.8 g/dL (ref 6.0–8.3)

## 2012-02-02 LAB — LIPID PANEL: Total CHOL/HDL Ratio: 3

## 2012-02-03 ENCOUNTER — Ambulatory Visit (INDEPENDENT_AMBULATORY_CARE_PROVIDER_SITE_OTHER): Payer: 59 | Admitting: Cardiology

## 2012-02-03 ENCOUNTER — Encounter: Payer: Self-pay | Admitting: Cardiology

## 2012-02-03 VITALS — BP 124/72 | HR 68 | Ht 76.0 in | Wt 207.0 lb

## 2012-02-03 DIAGNOSIS — E7800 Pure hypercholesterolemia, unspecified: Secondary | ICD-10-CM

## 2012-02-03 DIAGNOSIS — I2510 Atherosclerotic heart disease of native coronary artery without angina pectoris: Secondary | ICD-10-CM

## 2012-02-03 NOTE — Patient Instructions (Signed)
Your physician wants you to follow-up in: March 2014 with Dr Riley Kill. You will receive a reminder letter in the mail two months in advance. If you don't receive a letter, please call our office to schedule the follow-up appointment.  Your physician recommends that you continue on your current medications as directed. Please refer to the Current Medication list given to you today.

## 2012-02-03 NOTE — Progress Notes (Signed)
   HPI:  Jordan [name] is in for a follow up visit.  He is doing well, and enjoying what he describes as the freedom of a sabbatical.  He is doing quite a few things, and learning where to draw the line.  He gets regular exercise and experiences no ischemic type cardiac symptoms.  Prior to his PCI, he had typical symptoms.  We also reviewed his lipid profile, and we have been quite aggressive about treatment in light of his family history.   Current Outpatient Prescriptions  Medication Sig Dispense Refill  . aspirin 81 MG tablet Take 81 mg by mouth daily. Take two daily      . CRESTOR 40 MG tablet TAKE 1 TABLET BY MOUTH AT BEDTIME  90 tablet  0  . folic acid (FOLVITE) 400 MCG tablet Take 400 mcg by mouth daily.      . Multiple Vitamin (MULTIVITAMIN) capsule Take 1 capsule by mouth daily.        No Known Allergies  No past medical history on file.  No past surgical history on file.  Family History  Problem Relation Age of Onset  . Heart attack Brother 48  . Heart attack Father 63    deceased  . Heart attack      two uncles who died age 67-58  . Heart attack      mothers dad died at 75 of his third heart attack  . Mitral valve prolapse Mother   . Stroke  17    uncle     History   Social History  . Marital Status: Married    Spouse Name: N/A    Number of Children: N/A  . Years of Education: N/A   Occupational History  . Not on file.   Social History Main Topics  . Smoking status: Current Some Day Smoker  . Smokeless tobacco: Not on file  . Alcohol Use: Not on file  . Drug Use: Not on file  . Sexually Active: Not on file   Other Topics Concern  . Not on file   Social History Narrative  . No narrative on file    ROS: Please see the HPI.  All other systems reviewed and negative.  PHYSICAL EXAM:  BP 124/72  Pulse 68  Ht 6\' 4"  (1.93 m)  Wt 207 lb (93.895 kg)  BMI 25.20 kg/m2  General: Well developed, well nourished, thin gentleman  in no acute distress. Head:   Normocephalic and atraumatic. Neck: no JVD Lungs: Clear to auscultation and percussion. Heart: Normal S1 and S2.  No murmur, rubs or gallops.  Abdomen:  Normal bowel sounds; soft; non tender; no organomegaly Pulses: Pulses normal in all 4 extremities. Extremities: No clubbing or cyanosis. No edema. Neurologic: Alert and oriented x 3.  EKG:  NSR.  WNL    (delay in R wave likely secondary to lead position).   ASSESSMENT AND PLAN:

## 2012-02-22 ENCOUNTER — Ambulatory Visit (INDEPENDENT_AMBULATORY_CARE_PROVIDER_SITE_OTHER): Payer: 59 | Admitting: Internal Medicine

## 2012-02-22 ENCOUNTER — Telehealth: Payer: Self-pay | Admitting: Cardiology

## 2012-02-22 DIAGNOSIS — Z23 Encounter for immunization: Secondary | ICD-10-CM

## 2012-02-22 NOTE — Telephone Encounter (Signed)
Jordan [name] sent a completed release of information requesting the date of his last tetanus shot. I called him back at [phone] and spoke to him to  let him know we had nothing in the chart. 4/29 emg

## 2012-03-09 NOTE — Assessment & Plan Note (Signed)
Data reviewed with patient.  Had initial difficulties, then asymptomatic since stenting of the LAD with first generation DES.  He is on a REVERSAL/ASTEROID strategy for care with aggressive reduction of LDL at target.  Has had a successful outcome with nine years of event free survival, largely due to his good habits combined with aggressive prevention therapy.  Will continue that for now.  Patient in agreement.

## 2012-03-09 NOTE — Assessment & Plan Note (Signed)
Continues to do well from a cardiac standpoint.  Had typical symptoms prior to PCI.  None since.  Favor continued conservative management in the absence of new symptoms.  See cath report in overview.  He did have non target disease, but has been managed aggressively ever since.  We will continue that strategy of care.

## 2012-04-12 ENCOUNTER — Other Ambulatory Visit: Payer: Self-pay | Admitting: Cardiology

## 2012-04-13 ENCOUNTER — Other Ambulatory Visit: Payer: Self-pay | Admitting: Cardiology

## 2012-07-12 ENCOUNTER — Other Ambulatory Visit: Payer: Self-pay | Admitting: Cardiology

## 2012-08-08 ENCOUNTER — Telehealth: Payer: Self-pay | Admitting: Gastroenterology

## 2012-08-08 NOTE — Telephone Encounter (Signed)
Dr. Stark do you approve of transfer? 

## 2012-08-08 NOTE — Telephone Encounter (Signed)
OK with me.

## 2012-08-09 ENCOUNTER — Encounter: Payer: Self-pay | Admitting: Gastroenterology

## 2012-08-09 NOTE — Telephone Encounter (Signed)
Dr. Jarold Motto do you accept?

## 2012-08-09 NOTE — Telephone Encounter (Signed)
Patient scheduled.

## 2012-08-09 NOTE — Telephone Encounter (Signed)
Yes..friend of mine

## 2012-08-12 ENCOUNTER — Encounter: Payer: Self-pay | Admitting: *Deleted

## 2012-08-12 ENCOUNTER — Telehealth: Payer: Self-pay | Admitting: Gastroenterology

## 2012-08-12 NOTE — Telephone Encounter (Signed)
Copies of consent form, prep instructions, and pt acknowledgement form mailed to pt.

## 2012-08-18 ENCOUNTER — Telehealth: Payer: Self-pay | Admitting: Gastroenterology

## 2012-08-18 NOTE — Telephone Encounter (Signed)
Talked with pt; had questions about consent form.  Questions were answered.

## 2012-08-30 ENCOUNTER — Encounter: Payer: Self-pay | Admitting: Family Medicine

## 2012-08-30 ENCOUNTER — Ambulatory Visit (INDEPENDENT_AMBULATORY_CARE_PROVIDER_SITE_OTHER): Payer: 59 | Admitting: Family Medicine

## 2012-08-30 VITALS — BP 122/68 | HR 60 | Temp 98.0°F | Resp 12 | Ht 76.0 in | Wt 200.0 lb

## 2012-08-30 DIAGNOSIS — Z23 Encounter for immunization: Secondary | ICD-10-CM

## 2012-08-30 DIAGNOSIS — Z0000 Encounter for general adult medical examination without abnormal findings: Secondary | ICD-10-CM

## 2012-08-30 LAB — CBC WITH DIFFERENTIAL/PLATELET
Basophils Relative: 0.3 % (ref 0.0–3.0)
Eosinophils Absolute: 0.1 10*3/uL (ref 0.0–0.7)
Hemoglobin: 15.9 g/dL (ref 13.0–17.0)
MCHC: 33.1 g/dL (ref 30.0–36.0)
MCV: 96.1 fl (ref 78.0–100.0)
Monocytes Absolute: 0.5 10*3/uL (ref 0.1–1.0)
Neutro Abs: 3.9 10*3/uL (ref 1.4–7.7)
RBC: 5 Mil/uL (ref 4.22–5.81)

## 2012-08-30 LAB — HEPATIC FUNCTION PANEL
ALT: 25 U/L (ref 0–53)
Total Protein: 6.5 g/dL (ref 6.0–8.3)

## 2012-08-30 LAB — POCT URINALYSIS DIPSTICK
Ketones, UA: NEGATIVE
Leukocytes, UA: NEGATIVE
Protein, UA: NEGATIVE
pH, UA: 7

## 2012-08-30 LAB — LIPID PANEL: Cholesterol: 119 mg/dL (ref 0–200)

## 2012-08-30 LAB — BASIC METABOLIC PANEL
BUN: 11 mg/dL (ref 6–23)
Creatinine, Ser: 0.9 mg/dL (ref 0.4–1.5)
GFR: 88.73 mL/min (ref >60.00)
Potassium: 4.1 mEq/L (ref 3.5–5.1)

## 2012-08-30 LAB — PSA: PSA: 1.47 ng/mL (ref 0.10–4.00)

## 2012-08-30 MED ORDER — FLUTICASONE PROPIONATE 50 MCG/ACT NA SUSP: 2.0000 | Freq: Every day | NASAL | Status: DC

## 2012-08-30 MED ORDER — ZOSTER VACCINE LIVE 19400 UNT/0.65ML ~~LOC~~ SOLR: 0.6500 mL | Freq: Once | SUBCUTANEOUS | Status: DC

## 2012-08-30 NOTE — Patient Instructions (Addendum)
1.  Continue yearly flu vaccines 2.  Continue current diet and exercise habits with regular follow-up with your cardiologist Dr. Riley Kill. 3.  Consider yearly physical exam.

## 2012-08-30 NOTE — Progress Notes (Signed)
Subjective:     Patient ID: Jordan [name], male   DOB: 04/04/1951, 61 y.o.   MRN: [id number]  HPI Mr. Rill is a 61 year old male with history of hypercholesterolemia and CAD s/p LAD stent 2004 who presents today for a well physical and to establish care.  He was previously a patient of Dr. Arthur Holms, is seen regularly for cardiology by Dr. Riley Kill, and for GI/colonoscopy with Dr. Jarold Motto.    Other than some mild nasal congestion, which he says is generally his baseline, he denies any physical complaints today.  He is interested in information about the flu vaccine and Zostavax.  He denies any recent episodes of CP, SOB, or chest tightness, and will be having a colonoscopy on 09/26/12 with Dr. Jarold Motto.  PMH 1. Hypercholesterolemia 2. CAD 3. Vaccines - Tdap 01/2012  Surg Hx 1. 2001 - L5-S1 discectomy 2. 2004 - LAD stent 3. 2004 - R inguinal hernia repair 4. 2013 - minor surgery to remove cysts from back and neck  Family History 1. Father - died of MI age 56 2. Mother - A&W 3. Older brother - survived MI age 95 4. Younger brother - hypercholesterolemia 5. Younger sister - A&W 6. 3 daughters - all A&W 7. Maternal GFs and uncles: CAD, MI  Social History Retired ~15 months ago, formerly working for [employer].  Married, lives at home with wife.  All 3 adult daughters and 3 grandchildren living in or near [place].  Exercises regularly and eats healthy diet, given his history of LAD stent, and extensive family hx of CAD.  Denies smoking cigarettes, but smokes 20-25 cigars/year, and occasionally drinks EtOH   Review of Systems  Constitutional: Negative for activity change and unexpected weight change.  HENT: Positive for congestion.   Respiratory: Negative for chest tightness and shortness of breath.   Cardiovascular: Negative for chest pain and palpitations.  Gastrointestinal: Negative for abdominal distention.  Neurological: Negative for dizziness and weakness.    Hematological: Negative for adenopathy.       Objective:   Physical Exam  Constitutional: He is oriented to person, place, and time. He appears well-developed and well-nourished. No distress.  HENT:  Head: Normocephalic and atraumatic.  Right Ear: External ear normal.  Left Ear: External ear normal.  Mouth/Throat: Oropharynx is clear and moist. No oropharyngeal exudate.  Eyes: No scleral icterus.  Neck: Normal range of motion. No thyromegaly present.  Cardiovascular: Normal rate, regular rhythm and normal heart sounds.  Exam reveals no gallop and no friction rub.   No murmur heard. Pulmonary/Chest: Effort normal and breath sounds normal. No respiratory distress. He has no wheezes. He has no rales.  Abdominal: Soft. He exhibits no distension and no mass. There is no tenderness. There is no rebound and no guarding.  Genitourinary: Prostate normal and penis normal.  Lymphadenopathy:    He has no cervical adenopathy.  Neurological: He is alert and oriented to person, place, and time.  Skin: Skin is warm and dry. No rash noted. No erythema.  Psychiatric: He has a normal mood and affect. His behavior is normal.     Medications 1. Crestor 40mg  PO QD 2. Aspirin 161 mg PO QD 3. Multivitamin 4. Folic acid 400 mcg QD     Assessment:   61 year old male with history of hypercholesterolemia and CAD s/p LAD stent 2004 here for well physical and to establish care.    Plan:     1. CAD/hypercholesterolemia: currently well-controlled on Crestor and with good diet  and exercise habits.  Continue regular follow-up with Dr. Riley Kill. 2. Vaccinations: pt had questions about flu vaccine and Zostavax, mostly pertaining to contraindications and if they would be recommended for him.  Explained to him that flu vaccine is an inactivated vaccine, and would not make him sick, but is contraindicated in pts with history of egg or latex allergies or Guillain-Barre Syndrome, none of which he has.  Explained  that Zostavax is a live, attenuated virus and would be contraindicated in patients with immunocompromise.  He verbalized understanding and agreed to receive both vaccines today.  Also recommended Pneumovax given pt's cardiac disease history, which pt agreed to receive as well today.  Encouraged pt to continue with yearly flu vaccinations. 3. Nasal congestion: per pt's report, this is a fairly constant, but mild condition for him.  Suggested that it may not necessarily be allergic, but could be vasomotor, and that a trial of fluticasone nasal spray 2 sprays/nostril once daily may be beneficial, which he agreed to try.  Instructed pt that if he did not notice relief in 2 weeks that he should discontinue nasal spray. 4. Routine health maintenance: RTC in 1 year for complete physical and re-check.    Marthann Schiller, MS3  Agree with assessment and plan as above.  Obtained physical labs-CBC,Hepatic, BMP, Lipid, PSA, TSH  Evelena Peat MD

## 2012-08-31 NOTE — Progress Notes (Signed)
Quick Note:  Pt informed on personally identified VM ______ 

## 2012-09-01 ENCOUNTER — Encounter: Payer: Self-pay | Admitting: Family Medicine

## 2012-09-02 NOTE — Telephone Encounter (Signed)
Pt informed fluticasone was sent electronically to his pharmacy

## 2012-09-12 ENCOUNTER — Ambulatory Visit (AMBULATORY_SURGERY_CENTER): Payer: 59 | Admitting: *Deleted

## 2012-09-12 ENCOUNTER — Encounter: Payer: Self-pay | Admitting: Gastroenterology

## 2012-09-12 VITALS — Ht 76.0 in | Wt 200.0 lb

## 2012-09-12 DIAGNOSIS — Z1211 Encounter for screening for malignant neoplasm of colon: Secondary | ICD-10-CM

## 2012-09-12 MED ORDER — MOVIPREP 100 G PO SOLR: ORAL | Status: DC

## 2012-09-26 ENCOUNTER — Encounter: Payer: 59 | Admitting: Gastroenterology

## 2012-10-05 ENCOUNTER — Ambulatory Visit (AMBULATORY_SURGERY_CENTER): Payer: 59 | Admitting: Gastroenterology

## 2012-10-05 ENCOUNTER — Encounter: Payer: Self-pay | Admitting: Gastroenterology

## 2012-10-05 VITALS — BP 124/65 | HR 56 | Temp 97.5°F | Resp 18 | Ht 76.0 in | Wt 200.0 lb

## 2012-10-05 DIAGNOSIS — Z1211 Encounter for screening for malignant neoplasm of colon: Secondary | ICD-10-CM

## 2012-10-05 DIAGNOSIS — D126 Benign neoplasm of colon, unspecified: Secondary | ICD-10-CM

## 2012-10-05 DIAGNOSIS — K5730 Diverticulosis of large intestine without perforation or abscess without bleeding: Secondary | ICD-10-CM

## 2012-10-05 MED ORDER — SODIUM CHLORIDE 0.9 % IV SOLN: 500.0000 mL | INTRAVENOUS | Status: DC

## 2012-10-05 NOTE — Patient Instructions (Signed)
YOU HAD AN ENDOSCOPIC PROCEDURE TODAY AT THE [facility]: Refer to the procedure report that was given to you for any specific questions about what was found during the examination.  If the procedure report does not answer your questions, please call your gastroenterologist to clarify.  If you requested that your care partner not be given the details of your procedure findings, then the procedure report has been included in a sealed envelope for you to review at your convenience later.  YOU SHOULD EXPECT: Some feelings of bloating in the abdomen. Passage of more gas than usual.  Walking can help get rid of the air that was put into your GI tract during the procedure and reduce the bloating. If you had a lower endoscopy (such as a colonoscopy or flexible sigmoidoscopy) you may notice spotting of blood in your stool or on the toilet paper. If you underwent a bowel prep for your procedure, then you may not have a normal bowel movement for a few days.  DIET: Your first meal following the procedure should be a light meal and then it is ok to progress to your normal diet.  A half-sandwich or bowl of soup is an example of a good first meal.  Heavy or fried foods are harder to digest and may make you feel nauseous or bloated.  Likewise meals heavy in dairy and vegetables can cause extra gas to form and this can also increase the bloating.  Drink plenty of fluids but you should avoid alcoholic beverages for 24 hours.  ACTIVITY: Your care partner should take you home directly after the procedure.  You should plan to take it easy, moving slowly for the rest of the day.  You can resume normal activity the day after the procedure however you should NOT DRIVE or use heavy machinery for 24 hours (because of the sedation medicines used during the test).    SYMPTOMS TO REPORT IMMEDIATELY: A gastroenterologist can be reached at any hour.  During normal business hours, 8:30 AM to 5:00 PM Monday through Friday,  call [phone].  After hours and on weekends, please call the GI answering service at [phone] who will take a message and have the physician on call contact you.   Following lower endoscopy (colonoscopy or flexible sigmoidoscopy):  Excessive amounts of blood in the stool  Significant tenderness or worsening of abdominal pains  Swelling of the abdomen that is new, acute  Fever of 100F or higher    FOLLOW UP: If any biopsies were taken you will be contacted by phone or by letter within the next 1-3 weeks.  Call your gastroenterologist if you have not heard about the biopsies in 3 weeks.  Our staff will call the home number listed on your records the next business day following your procedure to check on you and address any questions or concerns that you may have at that time regarding the information given to you following your procedure. This is a courtesy call and so if there is no answer at the home number and we have not heard from you through the emergency physician on call, we will assume that you have returned to your regular daily activities without incident.  SIGNATURES/CONFIDENTIALITY: You and/or your care partner have signed paperwork which will be entered into your electronic medical record.  These signatures attest to the fact that that the information above on your After Visit Summary has been reviewed and is understood.  Full responsibility of the confidentiality   of this discharge information lies with you and/or your care-partner.     

## 2012-10-05 NOTE — Progress Notes (Signed)
Propofol given over incremental dosages 

## 2012-10-05 NOTE — Progress Notes (Signed)
Patient did not experience any of the following events: a burn prior to discharge; a fall within the facility; wrong site/side/patient/procedure/implant event; or a hospital transfer or hospital admission upon discharge from the facility. (G8907) Patient did not have preoperative order for IV antibiotic SSI prophylaxis. (G8918)  

## 2012-10-05 NOTE — Progress Notes (Signed)
Called to room to assist during endoscopic procedure.  Patient ID and intended procedure confirmed with present staff. Received instructions for my participation in the procedure from the performing physician.  

## 2012-10-05 NOTE — Op Note (Signed)
[facility] [address] [place], [address]   COLONOSCOPY PROCEDURE REPORT  PATIENT: Jordan [name], Jordan [name]  MR#: [id number] BIRTHDATE: 1951-08-19 , 61  yrs. old GENDER: Male ENDOSCOPIST: Mardella Layman, MD, [facility] REFERRED BY: PROCEDURE DATE:  10/05/2012 PROCEDURE:   Colonoscopy with snare polypectomy ASA CLASS:   Class II INDICATIONS:Average risk patient for colon cancer. MEDICATIONS: propofoll (Diprivan) 200mg  IV  DESCRIPTION OF PROCEDURE:   After the risks and benefits and of the procedure were explained, informed consent was obtained.  A digital rectal exam revealed no abnormalities of the rectum.    The LB [id number]  endoscope was introduced through the anus and advanced to the cecum, which was identified by both the appendix and ileocecal valve .  The quality of the prep was good, using MoviPrep .  The instrument was then slowly withdrawn as the colon was fully examined.     COLON FINDINGS: A smooth flat polyp measuring 1.2 cm in size was found at the cecum.  A polypectomy was performed using snare cautery.  The resection was complete and the polyp tissue was completely retrieved.   Moderate diverticulosis was noted in the descending colon and sigmoid colon.   The colon was otherwise normal.  There was no diverticulosis, inflammation, polyps or cancers unless previously stated.     Retroflexed views revealed no abnormalities.     The scope was then withdrawn from the patient and the procedure completed.  COMPLICATIONS: There were no complications. ENDOSCOPIC IMPRESSION: 1.   Flat polyp measuring 1.2 cm in size was found at the cecum; polypectomy was performed using snare cautery ..c/w adenoma... 2.   Moderate diverticulosis was noted in the descending colon and sigmoid colon 3.   The colon was otherwise normal  RECOMMENDATIONS: 1.  Await biopsy results...may need earlier exam per path report. 2.  Repeat colonoscopy in 5 years if polyp  adenomatous; otherwise 10 years 3.  high fiber diet  REPEAT EXAM:  WG:NFAOZ Burchette, MD  _______________________________ eSigned:  Mardella Layman, MD, [facility] 10/05/2012 11:43 AM     PATIENT NAME:  [name], [name] MR#: [id number]

## 2012-10-06 ENCOUNTER — Telehealth: Payer: Self-pay | Admitting: *Deleted

## 2012-10-06 NOTE — Telephone Encounter (Signed)
  Follow up Call-  Call back number 10/05/2012  Post procedure Call Back phone  # [phone] or cell [phone]  Permission to leave phone message Yes     Patient questions:  Do you have a fever, pain , or abdominal swelling? no Pain Score  0 *  Have you tolerated food without any problems? yes  Have you been able to return to your normal activities? yes  Do you have any questions about your discharge instructions: Diet   no Medications  no Follow up visit  no  Do you have questions or concerns about your Care? no  Actions: * If pain score is 4 or above: No action needed, pain <4.

## 2012-10-10 ENCOUNTER — Encounter: Payer: Self-pay | Admitting: Gastroenterology

## 2012-10-10 ENCOUNTER — Telehealth: Payer: Self-pay | Admitting: *Deleted

## 2012-10-10 NOTE — Telephone Encounter (Signed)
Patient stated that he saw in my chart that his colonoscopy path resulted.  Told patient that Dr. Jarold Motto has to review the results first and then he will send patient a letter. Told patient if he has no letter this week of some time next week to call.  Patient verbalized understanding.

## 2012-11-02 ENCOUNTER — Encounter: Payer: Self-pay | Admitting: Gastroenterology

## 2012-11-03 ENCOUNTER — Telehealth: Payer: Self-pay | Admitting: *Deleted

## 2012-11-03 NOTE — Telephone Encounter (Signed)
rec'd 12/21 e/mail telling me there was new msg for me in myChart, when found 12/21 entry in myChart, it referred me to 'test results' section, but that section has no new results in it for me, last one was 12/11, am I missing something? Informed pt the note/msg was for his path report, which he can't pull up. We discussed the path report and he has already received his path letter. Pt stated understanding.

## 2012-12-02 ENCOUNTER — Encounter: Payer: Self-pay | Admitting: Cardiology

## 2012-12-02 NOTE — Telephone Encounter (Signed)
Pt wants to know if he will meet the dr that dr Riley Kill wants to refer him to while at his appt, and does he need blood work? pls call , ok to leave detailed mesage

## 2012-12-02 NOTE — Telephone Encounter (Signed)
This encounter was created in error - please disregard.

## 2012-12-27 ENCOUNTER — Ambulatory Visit: Payer: 59 | Admitting: Cardiology

## 2013-01-25 ENCOUNTER — Ambulatory Visit (INDEPENDENT_AMBULATORY_CARE_PROVIDER_SITE_OTHER): Payer: 59 | Admitting: Cardiology

## 2013-01-25 ENCOUNTER — Encounter: Payer: Self-pay | Admitting: Cardiology

## 2013-01-25 VITALS — BP 106/64 | HR 65 | Ht 76.0 in | Wt 207.0 lb

## 2013-01-25 DIAGNOSIS — E7800 Pure hypercholesterolemia, unspecified: Secondary | ICD-10-CM

## 2013-01-25 DIAGNOSIS — I2510 Atherosclerotic heart disease of native coronary artery without angina pectoris: Secondary | ICD-10-CM

## 2013-01-25 NOTE — Progress Notes (Signed)
HPI:  This very nice patient returns today in a followup visit. He seems to be doing extremely well. He is now nearly 20 months since undertaking a "sabbatical".  No major cardiac symptoms.  Stays active.  Works out at [employer] with a Psychologist, educational two days per week.  Lipids done in the fall were at target.    Current Outpatient Prescriptions  Medication Sig Dispense Refill  . aspirin 81 MG tablet Take 81 mg by mouth daily. Take two daily      . clindamycin (CLEOCIN T) 1 % external solution Apply 1 application topically 2 (two) times daily. Per Dermatology      . CRESTOR 40 MG tablet TAKE 1 TABLET BY MOUTH AT BEDTIME  90 tablet  6  . folic acid (FOLVITE) 400 MCG tablet Take 400 mcg by mouth daily.      [name] ketoconazole (NIZORAL) 2 % shampoo Apply 1 application topically 2 (two) times a week.       . Multiple Vitamin (MULTIVITAMIN) capsule Take 1 capsule by mouth daily.      . fluticasone (FLONASE) 50 MCG/ACT nasal spray Place 2 sprays into the nose daily.  16 g  6   No current facility-administered medications for this visit.    No Known Allergies  Past Medical History  Diagnosis Date  . Colon polyps   . Heart disease   . Hyperlipidemia     Past Surgical History  Procedure Laterality Date  . Spine surgery  2001    microendoscopic discectomy  . Heart stent  2004  . Hernia repair  2004    right    Family History  Problem Relation Age of Onset  . Heart attack Brother 49  . Heart attack Father 26    deceased  . Heart attack      mothers dad died at 69 of his third heart attack/two uncles who died age 51-58  . Stroke  60    uncle   . Mitral valve prolapse Mother   . Colon cancer Neg Hx   . Stomach cancer Neg Hx     History   Social History  . Marital Status: Married    Spouse Name: N/A    Number of Children: N/A  . Years of Education: N/A   Occupational History  . Not on file.   Social History Main Topics  . Smoking status: Never Smoker   . Smokeless tobacco: Never  Used  . Alcohol Use: 1.8 oz/week    3 Cans of beer per week  . Drug Use: No  . Sexually Active: Not on file   Other Topics Concern  . Not on file   Social History Narrative  . No narrative on file    ROS: Please see the HPI.  All other systems reviewed and negative.  PHYSICAL EXAM:  BP 106/64  Pulse 65  Ht 6\' 4"  (1.93 m)  Wt 207 lb (93.895 kg)  BMI 25.21 kg/m2  SpO2 97%  General: Well developed, well nourished, in no acute distress. Head:  Normocephalic and atraumatic. Neck: no JVD Lungs: Clear to auscultation and percussion. Heart: Normal S1 and S2.  No murmur, rubs or gallops.  Abdomen:  Normal bowel sounds; soft; non tender; no organomegaly Pulses: Pulses normal in all 4 extremities. Extremities: No clubbing or cyanosis. No edema. Neurologic: Alert and oriented x 3.  EKG:  NSR.  Vertical axis.  No ST or T abnormalities.  No real change from one year earlier.  ASSESSMENT AND PLAN:  1.  CAD stable, no change 2.  Dyslipidemia, at target, at target with new guidelines.     Plan FU with Dr. Excell Seltzer in one year.

## 2013-01-25 NOTE — Patient Instructions (Signed)
**Note De-identified Raegan Winders Obfuscation** Your physician recommends that you continue on your current medications as directed. Please refer to the Current Medication list given to you today.  Your physician wants you to follow-up in: 1 year. You will receive a reminder letter in the mail two months in advance. If you don't receive a letter, please call our office to schedule the follow-up appointment.  

## 2013-01-27 NOTE — Assessment & Plan Note (Signed)
Patient currently is at target on treatment

## 2013-01-27 NOTE — Assessment & Plan Note (Signed)
He continues to do very well without limitation.  Continued medical therapy is warranted.  No new recommendations at this point in time.

## 2013-02-01 ENCOUNTER — Ambulatory Visit: Payer: 59 | Admitting: Cardiology

## 2013-02-14 ENCOUNTER — Ambulatory Visit (INDEPENDENT_AMBULATORY_CARE_PROVIDER_SITE_OTHER): Payer: 59 | Admitting: Family Medicine

## 2013-02-14 ENCOUNTER — Encounter: Payer: Self-pay | Admitting: Family Medicine

## 2013-02-14 VITALS — BP 98/60 | Temp 98.8°F

## 2013-02-14 DIAGNOSIS — M7660 Achilles tendinitis, unspecified leg: Secondary | ICD-10-CM

## 2013-02-14 DIAGNOSIS — M7662 Achilles tendinitis, left leg: Secondary | ICD-10-CM

## 2013-02-14 MED ORDER — DICLOFENAC SODIUM 1 % TD GEL: 2.0000 g | Freq: Three times a day (TID) | TRANSDERMAL | Status: DC

## 2013-02-14 NOTE — Progress Notes (Signed)
  Subjective:    Patient ID: Jordan [name], male    DOB: Aug 04, 1951, 62 y.o.   MRN: [id number]  HPI Acute Visit patient seen left Achilles pain. onset about 4 months ago. No history of injury. Pain actually improves after warming up with walking. Relatively mild pain usually 3-4/10 at worst. Has not tried any icing and anti-inflammatories other than baby aspirin. Has not done any stretches or exercises. Has never noted any bruising. No weakness with plantarflexion or dorsiflexion. Denies any ankle pain. No calf tenderness  Past Medical History  Diagnosis Date  . Colon polyps   . Heart disease   . Hyperlipidemia    Past Surgical History  Procedure Laterality Date  . Spine surgery  2001    microendoscopic discectomy  . Heart stent  2004  . Hernia repair  2004    right    reports that he has never smoked. He has never used smokeless tobacco. He reports that he drinks about 1.8 ounces of alcohol per week. He reports that he does not use illicit drugs. family history includes Heart attack in an unspecified family member; Heart attack (age of onset: 45) in his brother; Heart attack (age of onset: 59) in his father; Mitral valve prolapse in his mother; and Stroke (age of onset: 35) in an unspecified family member.  There is no history of Colon cancer and Stomach cancer. No Known Allergies     Review of Systems  Musculoskeletal: Negative for gait problem.  Neurological: Negative for weakness and numbness.       Objective:   Physical Exam  Cardiovascular: Normal rate and regular rhythm.   Musculoskeletal:  Patient has some mild swelling left Achilles tendon. Nontender. He has full range of motion with plantarflexion and dorsiflexion. No calf tenderness. No bony tenderness in the foot or ankle. No warmth. No erythema or ecchymosis.  Neurological:  Full-strength with plantar flexion and dorsi flexion          Assessment & Plan:  Left Achilles tendinitis We've recommended  gentle stretching, icing after activities, topical diclofenac gel 3 times daily and we gave him some exercises as tolerated

## 2013-02-14 NOTE — Patient Instructions (Signed)
Achilles Tendinitis  Tendinitis a swelling and soreness of the tendon. The pain in the tendon (cord-like structure which attaches muscle to bone) is produced by tiny tears and the inflammation present in that tendon. It commonly occurs at the shoulders, heels, and elbows. It is usually caused by overusing the tendon and joint involved. Achilles tendinitis involves the Achilles tendon. This is the large tendon in the back of the leg just above the foot. It attaches the large muscles of the lower leg to the heel bone (called calcaneus).   This diagnosis (learning what is wrong) is made by examination. X-rays will be generally be normal if only tendinitis is present.  HOME CARE INSTRUCTIONS    Apply ice to the injury for 15 to 20 minutes, 3 to 4 times per day. Put the ice in a plastic bag and place a towel between the bag of ice and your skin.   Try to avoid use other than gentle range of motion while the tendon is painful. Do not resume use until instructed by your caregiver. Then begin use gradually. Do not increase use to the point of pain. If pain does develop, decrease use and continue the above measures. Gradually increase activities that do not cause discomfort until you gradually achieve normal use.   Only take over-the-counter or prescription medicines for pain, discomfort, or fever as directed by your caregiver.  SEEK MEDICAL CARE IF:    Your pain and swelling increase or pain is uncontrolled with medications.   You develop new, unexplained problems (symptoms) or an increase of the symptoms that brought you to your caregiver.   You develop an inability to move your toes or foot, develop warmth and swelling in your foot, or begin running an unexplained temperature.  MAKE SURE YOU:    Understand these instructions.   Will watch your condition.   Will get help right away if you are not doing well or get worse.  Document Released: 07/22/2005 Document Revised: 01/04/2012 Document Reviewed:  05/30/2008  [employer] Patient Information 2013 [employer].

## 2013-02-16 ENCOUNTER — Encounter: Payer: Self-pay | Admitting: Family Medicine

## 2013-04-29 ENCOUNTER — Other Ambulatory Visit: Payer: Self-pay | Admitting: Cardiology

## 2013-05-01 ENCOUNTER — Telehealth: Payer: Self-pay | Admitting: Cardiovascular Disease

## 2013-05-01 MED ORDER — ROSUVASTATIN CALCIUM 40 MG PO TABS: ORAL_TABLET | ORAL | Status: DC

## 2013-05-01 NOTE — Telephone Encounter (Signed)
New Prob     Pt requesting a new prescription of CRESTOR.

## 2013-05-01 NOTE — Telephone Encounter (Signed)
Rx sent to pharmacy ([facility]) and message left on pt's voicemail with notification that Rx been addressed.

## 2013-08-31 ENCOUNTER — Other Ambulatory Visit: Payer: Self-pay

## 2013-12-25 ENCOUNTER — Telehealth: Payer: Self-pay | Admitting: Cardiovascular Disease

## 2013-12-25 DIAGNOSIS — E7800 Pure hypercholesterolemia, unspecified: Secondary | ICD-10-CM

## 2013-12-25 DIAGNOSIS — I2510 Atherosclerotic heart disease of native coronary artery without angina pectoris: Secondary | ICD-10-CM

## 2013-12-25 NOTE — Telephone Encounter (Signed)
New problem   Pt need to know if he need labs b/f his f/u appt.

## 2013-12-25 NOTE — Telephone Encounter (Signed)
Advised patient that I have ordered fasting labs prior to office visit with Dr. Burt Knack on 6/11.  Patient plans to have blood drawn on 6/9 - orders in epic and patient on lab schedule.  Patient verbalized understanding of instructions on fasting.

## 2014-04-03 ENCOUNTER — Other Ambulatory Visit (INDEPENDENT_AMBULATORY_CARE_PROVIDER_SITE_OTHER): Payer: 59

## 2014-04-03 DIAGNOSIS — I2510 Atherosclerotic heart disease of native coronary artery without angina pectoris: Secondary | ICD-10-CM

## 2014-04-03 DIAGNOSIS — E7800 Pure hypercholesterolemia, unspecified: Secondary | ICD-10-CM

## 2014-04-03 LAB — HEPATIC FUNCTION PANEL
ALK PHOS: 57 U/L (ref 39–117)
ALT: 28 U/L (ref 0–53)
AST: 23 U/L (ref 0–37)
Albumin: 3.4 g/dL — ABNORMAL LOW (ref 3.5–5.2)
BILIRUBIN DIRECT: 0.2 mg/dL (ref 0.0–0.3)
BILIRUBIN TOTAL: 0.7 mg/dL (ref 0.2–1.2)
TOTAL PROTEIN: 6 g/dL (ref 6.0–8.3)

## 2014-04-03 LAB — LIPID PANEL
CHOLESTEROL: 88 mg/dL (ref 0–200)
HDL: 31.3 mg/dL — ABNORMAL LOW (ref >39.00)
LDL Cholesterol: 51 mg/dL (ref 0–99)
NonHDL: 56.7
TRIGLYCERIDES: 30 mg/dL (ref 0.0–149.0)
Total CHOL/HDL Ratio: 3
VLDL: 6 mg/dL (ref 0.0–40.0)

## 2014-04-03 LAB — BASIC METABOLIC PANEL
BUN: 14 mg/dL (ref 6–23)
CALCIUM: 8.9 mg/dL (ref 8.4–10.5)
CO2: 30 mEq/L (ref 19–32)
CREATININE: 0.9 mg/dL (ref 0.4–1.5)
Chloride: 103 mEq/L (ref 96–112)
GFR: 92.92 mL/min (ref >60.00)
Glucose, Bld: 79 mg/dL (ref 70–99)
Potassium: 4.1 mEq/L (ref 3.5–5.1)
Sodium: 139 mEq/L (ref 135–145)

## 2014-04-05 ENCOUNTER — Ambulatory Visit (INDEPENDENT_AMBULATORY_CARE_PROVIDER_SITE_OTHER): Payer: 59 | Admitting: Cardiovascular Disease

## 2014-04-05 ENCOUNTER — Encounter: Payer: Self-pay | Admitting: Cardiovascular Disease

## 2014-04-05 VITALS — BP 118/62 | HR 62 | Ht 76.0 in | Wt 202.8 lb

## 2014-04-05 DIAGNOSIS — E7800 Pure hypercholesterolemia, unspecified: Secondary | ICD-10-CM

## 2014-04-05 DIAGNOSIS — I2510 Atherosclerotic heart disease of native coronary artery without angina pectoris: Secondary | ICD-10-CM

## 2014-04-05 NOTE — Progress Notes (Signed)
    HPI:  64 year old gentleman presenting for followup evaluation. He's been followed by Dr. Lia Foyer for many years. The patient has coronary artery disease and underwent PCI of the LAD in 2004. He had exertional angina at that time and was found to have subtotal occlusion of his proximal LAD. He was treated with a Cypher drug-eluting stent.  The patient had recent labs. His cholesterol is 88, triglycerides 30, HDL 31, and LDL 51. LFTs are within normal limits.  He is doing well. He continues to exercise regularly with no exertional symptoms. He does have occasional soreness in the chest, shoulder, and arm. He's not sure if this is heart related or if it is related to muscle soreness. His previous anginal equivalent was left arm discomfort during exercise and he has not had this since 2004.  Outpatient Encounter Prescriptions as of 04/05/2014  Medication Sig  . aspirin 81 MG tablet Take 81 mg by mouth daily. Take two daily  . clindamycin (CLEOCIN T) 1 % external solution Apply 1 application topically 2 (two) times daily. Per Dermatology  . folic acid (FOLVITE) 712 MCG tablet Take 400 mcg by mouth daily.  [name] ketoconazole (NIZORAL) 2 % shampoo Apply 1 application topically 2 (two) times a week.   . Multiple Vitamin (MULTIVITAMIN) capsule Take 1 capsule by mouth daily.  . rosuvastatin (CRESTOR) 40 MG tablet TAKE 1 TABLET BY MOUTH AT BEDTIME  . [DISCONTINUED] diclofenac sodium (VOLTAREN) 1 % GEL Apply 2 g topically 3 (three) times daily.  . [DISCONTINUED] fluticasone (FLONASE) 50 MCG/ACT nasal spray Place 2 sprays into the nose daily.    No Known Allergies  Past Medical History  Diagnosis Date  . Colon polyps   . Heart disease   . Hyperlipidemia     ROS: Negative except as per HPI  BP 118/62  Pulse 62  Ht 6\' 4"  (1.93 m)  Wt 91.989 kg (202 lb 12.8 oz)  BMI 24.70 kg/m2  PHYSICAL EXAM: Pt is alert and oriented, NAD HEENT: normal Neck: JVP - normal, carotids 2+= without bruits Lungs:  CTA bilaterally CV: RRR without murmur or gallop Abd: soft, NT, Positive BS, no hepatomegaly Ext: no C/C/E, distal pulses intact and equal Skin: warm/dry no rash  EKG:  Normal sinus rhythm 62 beats per minute, within normal limits.  ASSESSMENT AND PLAN: 1. Coronary artery disease, native vessel. The patient is stable and he continues with a good diet and exercise program. He is on high-dose statin drug for risk reduction. He tolerates low-dose aspirin. I have recommended an exercise treadmill test in the setting of his atypical chest and arm discomfort. I will plan on seeing him back next year for followup evaluation.  2. Hyperlipidemia. Cholesterol has consistently been in the 120 range. It has now dropped to 88. His HDL also had a 10 point drop. I am going to repeat his lipids when he comes in for the treadmill. I wonder if this is an erroneous lab. If it is accurate, will consider decreasing his Crestor 20 mg.  Sherren Mocha 04/05/2014 9:21 AM

## 2014-04-05 NOTE — Patient Instructions (Signed)
Your physician has requested that you have an exercise tolerance test. For further information please visit [email or web address]. Please also follow instruction sheet, as given.  Your physician recommends that you return for a FASTING (nothing to eat or drink after midnight) lipid profile the day of treadmill.  Your physician wants you to follow-up in: 1 YEAR with Dr Burt Knack.  You will receive a reminder letter in the mail two months in advance. If you don't receive a letter, please call our office to schedule the follow-up appointment.  Your physician recommends that you continue on your current medications as directed. Please refer to the Current Medication list given to you today.

## 2014-04-09 ENCOUNTER — Encounter (HOSPITAL_COMMUNITY): Payer: Self-pay | Admitting: Emergency Medicine

## 2014-04-09 ENCOUNTER — Telehealth: Payer: Self-pay | Admitting: Cardiovascular Disease

## 2014-04-09 ENCOUNTER — Emergency Department (HOSPITAL_COMMUNITY)
Admission: EM | Admit: 2014-04-09 | Discharge: 2014-04-09 | Disposition: A | Discharge status: Home / Self Care | Payer: 59 | Attending: Emergency Medicine | Admitting: Emergency Medicine

## 2014-04-09 ENCOUNTER — Emergency Department (HOSPITAL_COMMUNITY): Payer: 59

## 2014-04-09 DIAGNOSIS — E7800 Pure hypercholesterolemia, unspecified: Secondary | ICD-10-CM | POA: Diagnosis present

## 2014-04-09 DIAGNOSIS — M79609 Pain in unspecified limb: Secondary | ICD-10-CM | POA: Insufficient documentation

## 2014-04-09 DIAGNOSIS — I2510 Atherosclerotic heart disease of native coronary artery without angina pectoris: Secondary | ICD-10-CM

## 2014-04-09 DIAGNOSIS — Z860100 Personal history of colon polyps, unspecified: Secondary | ICD-10-CM | POA: Insufficient documentation

## 2014-04-09 DIAGNOSIS — Z9861 Coronary angioplasty status: Secondary | ICD-10-CM | POA: Insufficient documentation

## 2014-04-09 DIAGNOSIS — Z8679 Personal history of other diseases of the circulatory system: Secondary | ICD-10-CM | POA: Insufficient documentation

## 2014-04-09 DIAGNOSIS — Z7982 Long term (current) use of aspirin: Secondary | ICD-10-CM | POA: Insufficient documentation

## 2014-04-09 DIAGNOSIS — E785 Hyperlipidemia, unspecified: Secondary | ICD-10-CM | POA: Insufficient documentation

## 2014-04-09 DIAGNOSIS — Z792 Long term (current) use of antibiotics: Secondary | ICD-10-CM | POA: Insufficient documentation

## 2014-04-09 DIAGNOSIS — R079 Chest pain, unspecified: Secondary | ICD-10-CM

## 2014-04-09 DIAGNOSIS — Z79899 Other long term (current) drug therapy: Secondary | ICD-10-CM | POA: Insufficient documentation

## 2014-04-09 HISTORY — DX: Presence of coronary angioplasty implant and graft: Z95.5

## 2014-04-09 LAB — BASIC METABOLIC PANEL
BUN: 17 mg/dL (ref 6–23)
CHLORIDE: 102 meq/L (ref 96–112)
CO2: 24 meq/L (ref 19–32)
Calcium: 9.7 mg/dL (ref 8.4–10.5)
Creatinine, Ser: 0.87 mg/dL (ref 0.50–1.35)
GFR calc Af Amer: >90 mL/min (ref >90)
GFR calc non Af Amer: 90 mL/min — ABNORMAL LOW (ref >90)
Glucose, Bld: 98 mg/dL (ref 70–99)
Potassium: 4.6 mEq/L (ref 3.7–5.3)
Sodium: 138 mEq/L (ref 137–147)

## 2014-04-09 LAB — I-STAT TROPONIN, ED
Troponin i, poc: 0.01 ng/mL (ref 0.00–0.08)
Troponin i, poc: 0.03 ng/mL (ref 0.00–0.08)

## 2014-04-09 LAB — TROPONIN I: Troponin I: <0.3 ng/mL (ref <0.30)

## 2014-04-09 LAB — CBC
HEMATOCRIT: 47.5 % (ref 39.0–52.0)
Hemoglobin: 16.4 g/dL (ref 13.0–17.0)
MCH: 32.2 pg (ref 26.0–34.0)
MCHC: 34.5 g/dL (ref 30.0–36.0)
MCV: 93.3 fL (ref 78.0–100.0)
Platelets: 188 10*3/uL (ref 150–400)
RBC: 5.09 MIL/uL (ref 4.22–5.81)
RDW: 13 % (ref 11.5–15.5)
WBC: 7.9 10*3/uL (ref 4.0–10.5)

## 2014-04-09 MED ORDER — ASPIRIN 81 MG PO CHEW
162.0000 mg | CHEWABLE_TABLET | Freq: Once | ORAL | Status: AC
  Administered 2014-04-09: 162 mg via ORAL
  Filled 2014-04-09: qty 2

## 2014-04-09 MED ORDER — NITROGLYCERIN 0.4 MG SL SUBL: 0.4000 mg | SUBLINGUAL_TABLET | SUBLINGUAL | Status: DC | PRN

## 2014-04-09 NOTE — ED Notes (Signed)
PA at bedside.

## 2014-04-09 NOTE — ED Provider Notes (Signed)
CSN: [id number]     Arrival date & time 04/09/14  1216 History   First MD Initiated Contact with Patient 04/09/14 1237     Chief Complaint  Patient presents with  . Arm Pain  . Chest Pain     (Consider location/radiation/quality/duration/timing/severity/associated sxs/prior Treatment) HPI Comments: Patient presents to the emergency department with chief complaint of chest pain. He states the pain started this morning after exercising. He states the pain radiates to his left arm. States pain was a 5/10, but is currently a 1/10. History of CAD, and had a heart catheterization approximately 10 years ago by Dr. Lia Foyer. He is currently followed by Dr. Burt Knack. He has not taken anything to alleviate his symptoms. He endorses associated shortness of breath and diaphoresis, but states that he was exercising at the time, so this is not unexpected.  The history is provided by the patient. No language interpreter was used.    Past Medical History  Diagnosis Date  . Colon polyps   . Heart disease   . Hyperlipidemia   . S/P coronary artery stent placement    Past Surgical History  Procedure Laterality Date  . Spine surgery  2001    microendoscopic discectomy  . Heart stent  2004  . Hernia repair  2004    right   Family History  Problem Relation Age of Onset  . Heart attack Brother 31  . Heart attack Father 9    deceased  . Heart attack      mothers dad died at 15 of his third heart attack/two uncles who died age 75-58  . Stroke  53    uncle   . Mitral valve prolapse Mother   . Colon cancer Neg Hx   . Stomach cancer Neg Hx    History  Substance Use Topics  . Smoking status: Never Smoker   . Smokeless tobacco: Never Used  . Alcohol Use: 1.8 oz/week    3 Cans of beer per week    Review of Systems  All other systems reviewed and are negative.     Allergies  Review of patient's allergies indicates no known allergies.  Home Medications   Prior to Admission medications    Medication Sig Start Date End Date Taking? Authorizing Provider  aspirin 81 MG tablet Take 81 mg by mouth daily. Take two daily    Historical Provider, MD  clindamycin (CLEOCIN T) 1 % external solution Apply 1 application topically 2 (two) times daily. Per Dermatology 08/01/12   Historical Provider, MD  folic acid (FOLVITE) 263 MCG tablet Take 400 mcg by mouth daily.    Historical Provider, MD  ketoconazole (NIZORAL) 2 % shampoo Apply 1 application topically 2 (two) times a week.  08/01/12   Historical Provider, MD  Multiple Vitamin (MULTIVITAMIN) capsule Take 1 capsule by mouth daily.    Historical Provider, MD  rosuvastatin (CRESTOR) 40 MG tablet TAKE 1 TABLET BY MOUTH AT BEDTIME 05/01/13   Sherren Mocha, MD   BP 133/75  Pulse 75  Temp(Src) 98 F (36.7 C) (Oral)  Resp 18  Wt 204 lb (92.534 kg)  SpO2 97% Physical Exam  Nursing note and vitals reviewed. Constitutional: He is oriented to person, place, and time. He appears well-developed and well-nourished.  HENT:  Head: Normocephalic and atraumatic.  Eyes: Conjunctivae and EOM are normal. Pupils are equal, round, and reactive to light. Right eye exhibits no discharge. Left eye exhibits no discharge. No scleral icterus.  Neck: Normal range of motion.  Neck supple. No JVD present.  Cardiovascular: Normal rate, regular rhythm and normal heart sounds.  Exam reveals no gallop and no friction rub.   No murmur heard. Pulmonary/Chest: Effort normal and breath sounds normal. No respiratory distress. He has no wheezes. He has no rales. He exhibits no tenderness.  Abdominal: Soft. He exhibits no distension and no mass. There is no tenderness. There is no rebound and no guarding.  Musculoskeletal: Normal range of motion. He exhibits no edema and no tenderness.  Neurological: He is alert and oriented to person, place, and time.  Skin: Skin is warm and dry.  Psychiatric: He has a normal mood and affect. His behavior is normal. Judgment and thought  content normal.    ED Course  Procedures (including critical care time) Results for orders placed during the hospital encounter of 04/09/14  CBC      Result Value Ref Range   WBC 7.9  4.0 - 10.5 K/uL   RBC 5.09  4.22 - 5.81 MIL/uL   Hemoglobin 16.4  13.0 - 17.0 g/dL   HCT 47.5  39.0 - 52.0 %   MCV 93.3  78.0 - 100.0 fL   MCH 32.2  26.0 - 34.0 pg   MCHC 34.5  30.0 - 36.0 g/dL   RDW 13.0  11.5 - 15.5 %   Platelets 188  150 - 400 K/uL  BASIC METABOLIC PANEL      Result Value Ref Range   Sodium 138  137 - 147 mEq/L   Potassium 4.6  3.7 - 5.3 mEq/L   Chloride 102  96 - 112 mEq/L   CO2 24  19 - 32 mEq/L   Glucose, Bld 98  70 - 99 mg/dL   BUN 17  6 - 23 mg/dL   Creatinine, Ser 0.87  0.50 - 1.35 mg/dL   Calcium 9.7  8.4 - 10.5 mg/dL   GFR calc non Af Amer 90 (*) >90 mL/min   GFR calc Af Amer >90  >90 mL/min  I-STAT TROPOININ, ED      Result Value Ref Range   Troponin i, poc 0.01  0.00 - 0.08 ng/mL   Comment 3            Dg Chest Portable 1 View  04/09/2014   CLINICAL DATA:  Chest arm pain.  EXAM: PORTABLE CHEST - 1 VIEW  COMPARISON:  None.  FINDINGS: Lungs are clear without edema or focal airspace consolidation. No pleural effusion. The cardiopericardial silhouette is within normal limits for size. Imaged bony structures of the thorax are intact. Telemetry leads overlie the chest.  IMPRESSION: No acute cardiopulmonary findings.   Electronically Signed   By: Misty Stanley M.D.   On: 04/09/2014 13:20      EKG Interpretation   Date/Time:  Monday April 09 2014 12:21:27 EDT Ventricular Rate:  80 PR Interval:  134 QRS Duration: 80 QT Interval:  358 QTC Calculation: 412 R Axis:   87 Text Interpretation:  Normal sinus rhythm Normal ECG Sinus rhythm Artifact  Abnormal ekg Confirmed by Carmin Muskrat  MD ([id number]) on 04/09/2014  12:41:57 PM      MDM   Final diagnoses:  None    Patient with history of CAD, and stent placement. Complaint of chest pain while exercising this  morning. Will check labs, treat pain, and will reevaluate. Anticipate cardiology consultation.  Troponin and EKG are normal. Will consult cardiology.  3:40 PM Cards to see patient.  Patient signed out to [place], [place]  Montine Circle, PA-C 04/09/14 1541

## 2014-04-09 NOTE — ED Notes (Signed)
Cardiology at bedside.

## 2014-04-09 NOTE — Telephone Encounter (Signed)
Patient had work out with Clinical research associate. He is now SOB, having chest pressure & L arm pain. Sent phone call to [facility] in triage to speak with patient/cc

## 2014-04-09 NOTE — ED Notes (Signed)
Pt brought back to room via wheelchair with family in tow; pt getting undressed and into a gown at this time; Amy, NT present in room

## 2014-04-09 NOTE — Discharge Instructions (Signed)
please do not eat anything after midnight this evening. The cardiologist office will call you in the morning to give you time of appointment for further tests

## 2014-04-09 NOTE — H&P (Signed)
Patient ID: Jordan [name] MRN: [id number], DOB/AGE: 63-19-1952   Admit date: 04/09/2014   Primary Physician: Eulas Post, MD Primary Cardiologist: Dr Jerilynn Mages. Burt Knack  HPI: 63 y/o with a history of LAD DES Jan 2004. He has not had problems since. Today he did his usual exercise routine and walked 3 blocks home from the gym. After he got home he noted Lt arm discomfort- arm pit to above elbow. His symptoms were similar to pre PCI symptoms. He denies any SOB, nausea, diaphoresis, or anginal pain. This was around 11 am. He called the office and was told to come to the ER- he has been here since 1pm. He has received no NTG, His symptoms resolved shortly after he got here.    Problem List: Past Medical History  Diagnosis Date  . Colon polyps   . Heart disease   . Hyperlipidemia   . S/P coronary artery stent placement     Past Surgical History  Procedure Laterality Date  . Spine surgery  2001    microendoscopic discectomy  . Heart stent  2004  . Hernia repair  2004    right     Allergies: No Known Allergies   Home Medications Current Facility-Administered Medications  Medication Dose Route Frequency Provider Last Rate Last Dose  . nitroGLYCERIN (NITROSTAT) SL tablet 0.4 mg  0.4 mg Sublingual Q5 min PRN Carmin Muskrat, MD       Current Outpatient Prescriptions  Medication Sig Dispense Refill  . aspirin 81 MG tablet Take 81 mg by mouth daily. Take two daily      . clindamycin (CLEOCIN T) 1 % external solution Apply 1 application topically 2 (two) times daily. Per Dermatology      . folic acid (FOLVITE) 332 MCG tablet Take 400 mcg by mouth daily.      [name] ketoconazole (NIZORAL) 2 % shampoo Apply 1 application topically 2 (two) times a week.       . Multiple Vitamin (MULTIVITAMIN) capsule Take 1 capsule by mouth daily.      . rosuvastatin (CRESTOR) 40 MG tablet Take 40 mg by mouth at bedtime.         Family History  Problem Relation Age of Onset  . Heart attack Brother  72  . Heart attack Father 17    deceased  . Heart attack      mothers dad died at 74 of his third heart attack/two uncles who died age 98-58  . Stroke  65    uncle   . Mitral valve prolapse Mother   . Colon cancer Neg Hx   . Stomach cancer Neg Hx      History   Social History  . Marital Status: Married    Spouse Name: N/A    Number of Children: N/A  . Years of Education: N/A   Occupational History  . Not on file.   Social History Main Topics  . Smoking status: Never Smoker   . Smokeless tobacco: Never Used  . Alcohol Use: 1.8 oz/week    3 Cans of beer per week  . Drug Use: No  . Sexual Activity: Not on file   Other Topics Concern  . Not on file   Social History Narrative  . No narrative on file     Review of Systems: General: negative for chills, fever, night sweats or weight changes.  Cardiovascular: negative for chest pain, dyspnea on exertion, edema, orthopnea, palpitations, paroxysmal nocturnal dyspnea or shortness of breath  Dermatological: negative for rash Respiratory: negative for cough or wheezing Urologic: negative for hematuria Abdominal: negative for nausea, vomiting, diarrhea, bright red blood per rectum, melena, or hematemesis Neurologic: negative for visual changes, syncope, or dizziness All other systems reviewed and are otherwise negative except as noted above.  Physical Exam: Blood pressure 122/51, pulse 57, temperature 98 F (36.7 C), temperature source Oral, resp. rate 17, weight 92.534 kg (204 lb), SpO2 100.00%.  General appearance: alert, cooperative and no distress Neck: no carotid bruit and no JVD Lungs: clear to auscultation bilaterally Heart: regular rate and rhythm Abdomen: soft, non-tender; bowel sounds normal; no masses,  no organomegaly Extremities: extremities normal, atraumatic, no cyanosis or edema Pulses: 2+ and symmetric Skin: Skin color, texture, turgor normal. No rashes or lesions Neurologic: Grossly normal    Labs:    Results for orders placed during the hospital encounter of 04/09/14 (from the past 24 hour(s))  CBC     Status: None   Collection Time    04/09/14 12:45 PM      Result Value Ref Range   WBC 7.9  4.0 - 10.5 K/uL   RBC 5.09  4.22 - 5.81 MIL/uL   Hemoglobin 16.4  13.0 - 17.0 g/dL   HCT 47.5  39.0 - 52.0 %   MCV 93.3  78.0 - 100.0 fL   MCH 32.2  26.0 - 34.0 pg   MCHC 34.5  30.0 - 36.0 g/dL   RDW 13.0  11.5 - 15.5 %   Platelets 188  150 - 400 K/uL  BASIC METABOLIC PANEL     Status: Abnormal   Collection Time    04/09/14 12:45 PM      Result Value Ref Range   Sodium 138  137 - 147 mEq/L   Potassium 4.6  3.7 - 5.3 mEq/L   Chloride 102  96 - 112 mEq/L   CO2 24  19 - 32 mEq/L   Glucose, Bld 98  70 - 99 mg/dL   BUN 17  6 - 23 mg/dL   Creatinine, Ser 0.87  0.50 - 1.35 mg/dL   Calcium 9.7  8.4 - 10.5 mg/dL   GFR calc non Af Amer 90 (*) >90 mL/min   GFR calc Af Amer >90  >90 mL/min  I-STAT TROPOININ, ED     Status: None   Collection Time    04/09/14 12:56 PM      Result Value Ref Range   Troponin i, poc 0.01  0.00 - 0.08 ng/mL   Comment 3           I-STAT TROPOININ, ED     Status: None   Collection Time    04/09/14  4:01 PM      Result Value Ref Range   Troponin i, poc 0.03  0.00 - 0.08 ng/mL   Comment 3              Radiology/Studies: Dg Chest Portable 1 View  04/09/2014   CLINICAL DATA:  Chest arm pain.  EXAM: PORTABLE CHEST - 1 VIEW  COMPARISON:  None.  FINDINGS: Lungs are clear without edema or focal airspace consolidation. No pleural effusion. The cardiopericardial silhouette is within normal limits for size. Imaged bony structures of the thorax are intact. Telemetry leads overlie the chest.  IMPRESSION: No acute cardiopulmonary findings.   Electronically Signed   By: Misty Stanley M.D.   On: 04/09/2014 13:20    EKG:NSR without acute changes  ASSESSMENT AND PLAN:  Principal Problem:  Lt arm pain with moderate risk of acute coronary syndrome Active Problems:   CAD- LAD  DES Jan 2004   HYPERCHOLESTEROLEMIA   PLAN: Troponin negative x 2, no further pain and normal EKG. ? OP GXT    SignedErlene Quan, PA-C 04/09/2014, 6:48 PM  Patient seen and examined with Kerin Ransom, PA-C. We discussed all aspects of the encounter. I agree with the assessment and plan as stated above.   I suspect arm pain is non-cardiac. He did have arm pain with previous angina but this was accompanied by exertional CP as well which he currently does not have. ECG and CE look good. We discussed overnight observation vs home with outpatient GXT. We have decided on the latter. He is going out of town on Wednesday so we will try to arrange GXT for tomorrow, if possible. He knows to return if symptoms recur.   Benay Spice 7:32 PM

## 2014-04-09 NOTE — ED Provider Notes (Signed)
  This was a shared visit with a mid-level provided (NP or PA).  Throughout the patient's course I was available for consultation/collaboration.  I saw the ECG (sinus rhythm, rate 80, normal), relevant labs and studies - I agree with the interpretation.  On my exam the patient was in no distress.  With history of coronary disease, his new exertional chest pain, he was admitted for further evaluation and management.      Carmin Muskrat, MD 04/09/14 [phone]

## 2014-04-09 NOTE — ED Provider Notes (Signed)
Patient hand of to me by robert browning, PA-C. Dr. Tempie Hoist has just seen patient and feels that it is safe for him to go home with no EKG changes, two negative Troponins and has been pain free for the last few hours. Will be set up with outpatient tests which will be arranged by cardiology.  63 y.o.Jordan [name]'s evaluation in the Emergency Department is complete. It has been determined that no acute conditions requiring further emergency intervention are present at this time. The patient/guardian have been advised of the diagnosis and plan. We have discussed signs and symptoms that warrant return to the ED, such as changes or worsening in symptoms.  Vital signs are stable at discharge. Filed Vitals:   04/09/14 1815  BP: 122/51  Pulse: 57  Temp:   Resp: 17    Patient/guardian has voiced understanding and agreed to follow-up with the PCP or specialist.   Linus Mako, PA-C 04/09/14 1911

## 2014-04-09 NOTE — Telephone Encounter (Signed)
Spoke with pt who reports working out with his trainer this AM.  He was SOB during the work out which is not abnormal for him however it has continued since the work out which is abnormal.  He has a noticeable chest discomfort than he wouldn't classify as pain but is present.  The most concerning symptom he has is a left arm pain that some times travels to his back under his shoulder.  This is from description very much like the pain he had 10 or 12 years ago when he ended up with stent placement by Dr Lia Foyer.  The patient denies any injury and no muscle soreness.  He does not have any SL Ntg.  He is advised to report to the ED for further evaluation.  I will let our team at the hospital know he is coming and forward to Dr Burt Knack for his knowledge.

## 2014-04-09 NOTE — ED Notes (Signed)
Pt reports left arm pain, and left chest pain onset this AM after exercise. Pt states similar symptoms with previous MI and stent placement. Pt called cardiologist who told pt to come straight to ED. Pt awake, alert, oriented x4, NAD at present.

## 2014-04-10 ENCOUNTER — Other Ambulatory Visit: Payer: Self-pay | Admitting: *Deleted

## 2014-04-10 ENCOUNTER — Telehealth: Payer: Self-pay | Admitting: *Deleted

## 2014-04-10 ENCOUNTER — Telehealth: Payer: Self-pay | Admitting: Cardiovascular Disease

## 2014-04-10 ENCOUNTER — Ambulatory Visit (HOSPITAL_COMMUNITY)
Admission: RE | Admit: 2014-04-10 | Discharge: 2014-04-10 | Disposition: A | Discharge status: Home / Self Care | Payer: 59 | Source: Ambulatory Visit | Attending: Cardiovascular Disease | Admitting: Cardiovascular Disease

## 2014-04-10 DIAGNOSIS — I2510 Atherosclerotic heart disease of native coronary artery without angina pectoris: Secondary | ICD-10-CM

## 2014-04-10 DIAGNOSIS — E7800 Pure hypercholesterolemia, unspecified: Secondary | ICD-10-CM | POA: Insufficient documentation

## 2014-04-10 DIAGNOSIS — E782 Mixed hyperlipidemia: Secondary | ICD-10-CM

## 2014-04-10 LAB — LIPID PANEL
CHOLESTEROL: 127 mg/dL (ref 0–200)
HDL: 41 mg/dL (ref >39)
LDL Cholesterol: 70 mg/dL (ref 0–99)
TRIGLYCERIDES: 82 mg/dL (ref <150)
Total CHOL/HDL Ratio: 3.1 Ratio
VLDL: 16 mg/dL (ref 0–40)

## 2014-04-10 NOTE — Procedures (Signed)
Exercise Treadmill Test  Pre-Exercise Testing Evaluation Rhythm: normal sinus  Rate: 68   ST Segments:  no significant ST changes at rest     Test  Exercise Tolerance Test Ordering MD: Sherren Mocha, MD  Interpreting MD: Leonie Man, MD  Unique Test No: 1  Treadmill:  1  Indication for ETT: left arm pain  Contraindication to ETT: No   Stress Modality: exercise - treadmill  Cardiac Imaging Performed: non   Protocol: standard Bruce - maximal  Max BP:  183/68  Max MPHR (bpm):  157 85% MPR (bpm):  133  MPHR obtained (bpm):  150 % MPHR obtained:  95  Reached 85% MPHR (min:sec):  9:40 Total Exercise Time (min-sec):  12  Workload in METS:  13.4 Borg Scale: 14  Reason ETT Terminated:  patient's desire to stop    ST Segment Analysis At Rest: normal ST segments - no evidence of significant ST depression With Exercise: non-specific ST changes - upsloping ST depressions  Other Information Arrhythmia:  No Angina during ETT:  absent (0) Quality of ETT:  diagnostic  ETT Interpretation:  normal - no evidence of ischemia by ST analysis  Comments: Excellent Exercise Tolerance for Age  Normal BP response to exercise Excellent HR recovery. RPP = 27450  [facility] TM SCORE: + 14; LOW RISK  Recommendations: No further imagine modality necessary  HARDING,Darvell W, M.D., M.S. Interventional Cardiologist   Pager # [phone] 04/11/2014

## 2014-04-10 NOTE — Telephone Encounter (Signed)
I spoke with Jordan [name] at the [facility] office and the pt is getting ready to have a stress test performed.  Jordan [name] will also order the Lipid panel to be drawn by [facility].

## 2014-04-10 NOTE — Telephone Encounter (Signed)
New message  Pt called. Requests a call back no further details. Requests to follow up on results from test//SR

## 2014-04-10 NOTE — Telephone Encounter (Signed)
Message copied by Chauncy Lean on Tue Apr 10, 2014 10:36 AM ------      Message from: Erlene Quan      Created: Mon Apr 09, 2014  7:22 PM       Dr Haroldine Laws and I saw Mr Eggenberger in the ER 6/15. He would like the pt to have a regular treadmill 04/10/14 (for arm pain) if at all possible-Please contact the pt either way.      Thanks            Kerin Ransom PA-C      04/09/2014      7:24 PM       ------

## 2014-04-10 NOTE — ED Provider Notes (Signed)
Medical screening examination/treatment/procedure(s) were performed by non-physician practitioner and as supervising physician I was immediately available for consultation/collaboration.   EKG Interpretation   Date/Time:  Monday April 09 2014 12:21:27 EDT Ventricular Rate:  80 PR Interval:  134 QRS Duration: 80 QT Interval:  358 QTC Calculation: 412 R Axis:   87 Text Interpretation:  Normal sinus rhythm Normal ECG Sinus rhythm Artifact  Abnormal ekg Confirmed by Carmin Muskrat  MD ([id number]) on 04/09/2014  12:41:57 PM       Richarda Blade, MD 04/10/14 1217

## 2014-04-10 NOTE — Telephone Encounter (Signed)
The pt is anxious to find out the results of GXT.  The pt is scheduled to travel out of town tomorrow afternoon. I will follow up with the pt tomorrow about GXT and Lipid results.

## 2014-04-10 NOTE — Telephone Encounter (Signed)
I spoke with patient he is fasting and ready to proceed with a GXT.  I spoke with Ebony. She will put the patient on the schedule and contact the patient about date and time.

## 2014-04-11 MED ORDER — NITROGLYCERIN 0.4 MG SL SUBL: 0.4000 mg | SUBLINGUAL_TABLET | SUBLINGUAL | Status: DC | PRN

## 2014-04-11 NOTE — Telephone Encounter (Signed)
F/u    Pt need renew prescription for nitro pills. Pt don't want you to call it in until you speak with him. Please call pt.

## 2014-04-11 NOTE — Telephone Encounter (Signed)
I spoke with the pt and made him aware of GXT and Lipid results. The pt will be leaving home shortly for vacation and would like a NTG Rx sent to a pharmacy in [place].  Rx sent electronically.

## 2014-05-03 ENCOUNTER — Other Ambulatory Visit: Payer: Self-pay | Admitting: Cardiovascular Disease

## 2014-05-14 ENCOUNTER — Encounter: Payer: 59 | Admitting: Physician Assistant

## 2014-05-14 ENCOUNTER — Other Ambulatory Visit: Payer: 59

## 2014-05-16 ENCOUNTER — Other Ambulatory Visit: Payer: Self-pay | Admitting: Cardiovascular Disease

## 2014-07-20 ENCOUNTER — Encounter: Payer: Self-pay | Admitting: Family Medicine

## 2014-07-20 ENCOUNTER — Telehealth: Payer: Self-pay | Admitting: Family Medicine

## 2014-07-20 ENCOUNTER — Ambulatory Visit (INDEPENDENT_AMBULATORY_CARE_PROVIDER_SITE_OTHER): Payer: 59 | Admitting: Family Medicine

## 2014-07-20 VITALS — BP 128/78 | HR 68 | Temp 97.5°F | Wt 207.0 lb

## 2014-07-20 DIAGNOSIS — M25511 Pain in right shoulder: Secondary | ICD-10-CM

## 2014-07-20 DIAGNOSIS — M25519 Pain in unspecified shoulder: Secondary | ICD-10-CM

## 2014-07-20 DIAGNOSIS — M25512 Pain in left shoulder: Secondary | ICD-10-CM

## 2014-07-20 NOTE — Progress Notes (Signed)
   Subjective:    Patient ID: Jordan [name], male    DOB: 09-25-1951, 63 y.o.   MRN: [id number]  Shoulder Pain    Patient seen with bilateral shoulder pain left greater than right. Duration right shoulder pain about 3 months and left approximate 4 months. No injury. He does do some weight lifting. Left shoulder seems to be worse with internal rotation and abduction. His right shoulder pain is somewhat more difficult to reproduce. He does have cardiac history but no recent chest pains. No exertional symptoms. Denies any weakness. Pain is relatively mild well 2-3/10. He has not taken any medications. He was concerned because of duration. He is still playing golf and this has not hindered his golf game significantly.  Past Medical History  Diagnosis Date  . Colon polyps   . Heart disease   . Hyperlipidemia   . S/P coronary artery stent placement    Past Surgical History  Procedure Laterality Date  . Spine surgery  2001    microendoscopic discectomy  . Heart stent  2004  . Hernia repair  2004    right    reports that he has never smoked. He has never used smokeless tobacco. He reports that he drinks about 1.8 ounces of alcohol per week. He reports that he does not use illicit drugs. family history includes Heart attack in an other family member; Heart attack (age of onset: 35) in his brother; Heart attack (age of onset: 2) in his father; Mitral valve prolapse in his mother; Stroke (age of onset: 24) in an other family member. There is no history of Colon cancer or Stomach cancer. No Known Allergies    Review of Systems     Objective:   Physical Exam  Constitutional: He appears well-developed and well-nourished.  Cardiovascular: Normal rate and regular rhythm.   Pulmonary/Chest: Effort normal and breath sounds normal. No respiratory distress. He has no wheezes. He has no rales.  Musculoskeletal:  Shoulder symmetric in appearance. No muscle atrophy. Both shoulders show good range  of motion. He has mild pain with internal rotation and abduction left shoulder. No biceps tenderness. No a.c. joint tenderness.  Neurological:  No rotator cuff weakness.          Assessment & Plan:  Bilateral shoulder pain. Question rotator cuff tendinitis. Set up sports medicine referral. Patient agrees

## 2014-07-20 NOTE — Progress Notes (Signed)
Pre visit review using our clinic review tool, if applicable. No additional management support is needed unless otherwise documented below in the visit note. 

## 2014-07-20 NOTE — Telephone Encounter (Signed)
Patient Information:  Caller Name: [place]  Phone: [phone]  Patient: Jordan [name], Jordan [name]  Gender: Male  DOB: 09/26/1951  Age: 63 Years  PCP: Carolann Littler [facility])  Office Follow Up:  Does the office need to follow up with this patient?: No  Instructions For The Office: N/A  RN Note:  Agreed to see MD today for mild bilateral shoulder pain present > 7 days per Shoulder Pain.  Symptoms  Reason For Call & Symptoms: Bilateral shoulder problem "without pain." Left shoulder discomfort, for past 2-4 months only when "turns it just so."  Able to play golf without pain.  Cardiologist suspects a rotator cuff impingement.  Asking for how to manage, strenghen and if needs treatment to prevent it from worsening.   Right shoulder has mild discomfort (1/10) when squeezed with slight soreness of tricepts to elbow and forearm present for 2-2.5 months.  Reviewed Health History In EMR: Yes  Reviewed Medications In EMR: Yes  Reviewed Allergies In EMR: Yes  Reviewed Surgeries / Procedures: Yes  Date of Onset of Symptoms: 05/19/2014  Treatments Tried: shoulder exercises, works with Physiological scientist twice a week for 30 minutes.  Treatments Tried Worked: Yes  Guideline(s) Used:  Shoulder Pain  Disposition Per Guideline:   See Within 2 Weeks in Office  Reason For Disposition Reached:   Mild pain (e.g., does not interfere with normal activities) and present > 7 days  Advice Given:  Reassurance - Shoulder Pain  Usually shoulder pain is not serious. You have told me that there is no redness, numbness, or swelling.  Causes of shoulder pain can include a strained muscle, a forgotten minor injury, and tendinitis.  Pain Medicines:  For pain relief, you can take either acetaminophen, ibuprofen, or naproxen.  They are over-the-counter (OTC) pain drugs. You can buy them at the drugstore.  Call Back If  Moderate pain (e.g. interferes with normal activities) lasts over 3 days  Mild pain lasts over  7 days  You become worse.  Patient Will Follow Care Advice:  YES  Appointment Scheduled:  07/20/2014 14:45:00 Appointment Scheduled Provider:  Carolann Littler [facility])

## 2014-07-20 NOTE — Telephone Encounter (Signed)
Noted  

## 2014-07-30 ENCOUNTER — Other Ambulatory Visit (INDEPENDENT_AMBULATORY_CARE_PROVIDER_SITE_OTHER): Payer: 59

## 2014-07-30 ENCOUNTER — Encounter: Payer: Self-pay | Admitting: Family Medicine

## 2014-07-30 ENCOUNTER — Ambulatory Visit (INDEPENDENT_AMBULATORY_CARE_PROVIDER_SITE_OTHER): Payer: 59 | Admitting: Family Medicine

## 2014-07-30 VITALS — BP 142/80 | HR 63 | Ht 76.0 in | Wt 205.0 lb

## 2014-07-30 DIAGNOSIS — M25512 Pain in left shoulder: Secondary | ICD-10-CM

## 2014-07-30 DIAGNOSIS — M7552 Bursitis of left shoulder: Secondary | ICD-10-CM

## 2014-07-30 NOTE — Progress Notes (Signed)
[facility] [address] [place], [place] [address] Phone: [phone] Subjective:    I'm seeing this patient by the request  of:  Eulas Post, MD   CC: Shoulder pain  [id number] Jordan [name] is a 63 y.o. male coming in with complaint of bilateral shoulder pain left greater than right. Patient states that he has had this pain for approximately 4 months. Patient does try to do some weightlifting on a regular basis and was starting to have some difficulty. Patient does not remember any true injury. Patient states that he does have a sharp pain with certain movements and then dull aching pain that last-minute hours. Patient has not tried any over-the-counter medications secondary to his cardiac history. Patient states that he can do all daily activities and continues to work out on a fairly regular basis. Denies any significant radiation down the arm but does have some mild discomfort of the right elbow from time to time. 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 Patient rates the severity of 3/10. Patient like to know if there is anything to do to get him back to pain-free.     Past medical history, social, surgical and family history all reviewed in electronic medical record.   Review of Systems: No headache, visual changes, nausea, vomiting, diarrhea, constipation, dizziness, abdominal pain, skin rash, fevers, chills, night sweats, weight loss, swollen lymph nodes, body aches, joint swelling, muscle aches, chest pain, shortness of breath, mood changes.   Objective Blood pressure 142/80, pulse 63, height 6\' 4"  (1.93 m), weight 205 lb (92.987 kg), SpO2 98.00%.  General: No apparent distress alert and oriented x3 mood and affect normal, dressed appropriately.  HEENT: Pupils equal, extraocular movements intact  Respiratory: Patient's speak in full sentences and does not appear short of breath  Cardiovascular: No lower extremity edema, non  tender, no erythema  Skin: Warm dry intact with no signs of infection or rash on extremities or on axial skeleton.  Abdomen: Soft nontender  Neuro: Cranial nerves II through XII are intact, neurovascularly intact in all extremities with 2+ DTRs and 2+ pulses.  Lymph: No lymphadenopathy of posterior or anterior cervical chain or axillae bilaterally.  Gait normal with good balance and coordination.  MSK:  Non tender with full range of motion and good stability and symmetric strength and tone of  elbows, wrist, hip, knee and ankles bilaterally.  Shoulder: Left Inspection reveals no abnormalities, atrophy or asymmetry. Palpation is normal with no tenderness over AC joint or bicipital groove. ROM is full in all planes passively. Rotator cuff strength normal throughout. signs of impingement with positive Neer and Hawkin's tests, but negative empty can sign. Speeds and Yergason's tests normal. No labral pathology noted with negative Obrien's, negative clunk and good stability. Normal scapular function observed. No painful arc and no drop arm sign. No apprehension sign Contralateral shoulder shows mild impingement signs but overall significantly in better than his left side.  MSK US performed of: Left This study was ordered, performed, and interpreted by Charlann Boxer D.O.  Shoulder:   Supraspinatus:  Appears normal on long and transverse views, Bursal bulge seen with shoulder abduction on impingement view. Infraspinatus:  Appears normal on long and transverse views. Significant increase in Doppler flow Subscapularis:  Appears normal on long and transverse views. Positive bursa Teres Minor:  Appears normal on long and transverse views. AC joint:  Capsule undistended, no geyser sign. Glenohumeral Joint:  Appears normal without  effusion. Glenoid Labrum:  Intact without visualized tears. Biceps Tendon:  Appears normal on long and transverse views, no fraying of tendon, tendon located in  intertubercular groove, no subluxation with shoulder internal or external rotation.  Impression: Subacromial bursitis  Procedure: Real-time Ultrasound Guided Injection of left glenohumeral joint Device: GE Logiq E  Ultrasound guided injection is preferred based studies that show increased duration, increased effect, greater accuracy, decreased procedural pain, increased response rate with ultrasound guided versus blind injection.  Verbal informed consent obtained.  Time-out conducted.  Noted no overlying erythema, induration, or other signs of local infection.  Skin prepped in a sterile fashion.  Local anesthesia: Topical Ethyl chloride.  With sterile technique and under real time ultrasound guidance:  Joint visualized.  23g 1  inch needle inserted posterior approach. Pictures taken for needle placement. Patient did have injection of 2 cc of 1% lidocaine, 2 cc of 0.5% Marcaine, and 1.0 cc of Kenalog 40 mg/dL. Completed without difficulty  Pain immediately resolved suggesting accurate placement of the medication.  Advised to call if fevers/chills, erythema, induration, drainage, or persistent bleeding.  Images permanently stored and available for review in the ultrasound unit.  Impression: Technically successful ultrasound guided injection.     Impression and Recommendations:     This case required medical decision making of moderate complexity.

## 2014-07-30 NOTE — Assessment & Plan Note (Signed)
Patient was given an injection today. I believe the patient's right shoulder is only hurting him secondary to him using this are more secondary to pain in his left shoulder. Patient did respond externally well to the injection. We discussed some exercises and icing protocol, we discussed topical anti-inflammatory and patient was given a trial. Patient will try over-the-counter medications and then come back and see me again in 3 weeks for further evaluation and treatment.

## 2014-07-30 NOTE — Patient Instructions (Signed)
Good to see you.  Exercises 3 times a week.  Ice 20 minutes 2 times daily. Usually after activity and before bed. Vitamin D 2000 IU daily Turmeric 500mg  twice daily.  Pennsaid twice daily as needed.  Come back in 3 weeks.

## 2014-08-03 ENCOUNTER — Ambulatory Visit: Payer: 59 | Admitting: Family Medicine

## 2014-08-06 ENCOUNTER — Encounter: Payer: Self-pay | Admitting: Family Medicine

## 2014-08-06 MED ORDER — DICLOFENAC SODIUM 2 % TD SOLN: TRANSDERMAL | Status: DC

## 2014-08-20 ENCOUNTER — Ambulatory Visit: Payer: 59 | Admitting: Family Medicine

## 2014-08-27 ENCOUNTER — Encounter: Payer: Self-pay | Admitting: Family Medicine

## 2014-08-27 ENCOUNTER — Ambulatory Visit (INDEPENDENT_AMBULATORY_CARE_PROVIDER_SITE_OTHER): Payer: 59 | Admitting: Family Medicine

## 2014-08-27 VITALS — BP 128/78 | HR 61 | Ht 76.0 in | Wt 205.0 lb

## 2014-08-27 DIAGNOSIS — M25512 Pain in left shoulder: Secondary | ICD-10-CM

## 2014-08-27 DIAGNOSIS — M7552 Bursitis of left shoulder: Secondary | ICD-10-CM

## 2014-08-27 DIAGNOSIS — M25511 Pain in right shoulder: Secondary | ICD-10-CM

## 2014-08-27 NOTE — Patient Instructions (Signed)
Great to see you Jordan [name] is your friend Physical therapy will be calling you Continue the medication and fish oil 2 grams daily would be great.  See me again in 3-4 weeks.

## 2014-08-27 NOTE — Assessment & Plan Note (Signed)
Discussed with patient at great length. I do feel that patient has made improvement but continues to have some mild discomfort. I do think that the topical anti-inflammatory will be helpful. Patient will continue with the home exercises but we will send patient to formal physical therapy as well. We discussed proper lifting techniques and which lifting to avoid. We discussed continuing the other vitamins over-the-counter that can be helpful. We discussed the icing protocol in greater detail. Patient and will come back and see me again in 3-4 weeks. If patient has any difficulty with the right shoulder I would consider an intra-articular injection.  Spent greater than 25 minutes with patient face-to-face and had greater than 50% of counseling including as described above in assessment and plan.

## 2014-08-27 NOTE — Progress Notes (Signed)
  [facility] [address] [place], [place] [address] Phone: [phone] Subjective:     CC: Shoulder pain follow up  [id number] Jordan [name] is a 63 y.o. male coming in with complaint of bilateral shoulder pain left greater than right. Patient was seen previously and was given a injection into the left shoulder.patient states that he is approximately 80% better. Patient still states that he is having mild discomfort in the shoulders bilaterally. Denies any neck pain associated with it. Patient states that he is able to do more activity. Patient has been icing as well as doing the over-the-counter medications but has not been doing the exercises regularly. Patient has been traveling a significant amount which she states makes it difficult. Denies any pain at night. Overall seems to be doing better. Patient though would like to be pain free.     Past medical history, social, surgical and family history all reviewed in electronic medical record.   Review of Systems: No headache, visual changes, nausea, vomiting, diarrhea, constipation, dizziness, abdominal pain, skin rash, fevers, chills, night sweats, weight loss, swollen lymph nodes, body aches, joint swelling, muscle aches, chest pain, shortness of breath, mood changes.   Objective Blood pressure 128/78, pulse 61, height 6\' 4"  (1.93 m), weight 205 lb (92.987 kg), SpO2 98 %.  General: No apparent distress alert and oriented x3 mood and affect normal, dressed appropriately.  HEENT: Pupils equal, extraocular movements intact  Respiratory: Patient's speak in full sentences and does not appear short of breath  Cardiovascular: No lower extremity edema, non tender, no erythema  Skin: Warm dry intact with no signs of infection or rash on extremities or on axial skeleton.  Abdomen: Soft nontender  Neuro: Cranial nerves II through XII are intact, neurovascularly intact in all extremities with 2+ DTRs and 2+  pulses.  Lymph: No lymphadenopathy of posterior or anterior cervical chain or axillae bilaterally.  Gait normal with good balance and coordination.  MSK:  Non tender with full range of motion and good stability and symmetric strength and tone of  elbows, wrist, hip, knee and ankles bilaterally.  Shoulder: bilateral Inspection reveals no abnormalities, atrophy or asymmetry. Palpation is normal with no tenderness over AC joint or bicipital groove. ROM is full in all planes passively. Rotator cuff strength normal throughout. signs of impingement with positive Neer and Hawkin's tests, but negative empty can sign.improved from previous exam Speeds and Yergason's tests normal. No labral pathology noted with negative Obrien's, negative clunk and good stability. Normal scapular function observed. No painful arc and no drop arm sign. No apprehension sign .       Impression and Recommendations:     This case required medical decision making of moderate complexity.

## 2014-08-29 ENCOUNTER — Ambulatory Visit: Payer: 59 | Attending: Family Medicine

## 2014-08-29 DIAGNOSIS — R5381 Other malaise: Secondary | ICD-10-CM | POA: Insufficient documentation

## 2014-08-29 DIAGNOSIS — M25511 Pain in right shoulder: Secondary | ICD-10-CM | POA: Insufficient documentation

## 2014-08-29 DIAGNOSIS — M25512 Pain in left shoulder: Secondary | ICD-10-CM | POA: Insufficient documentation

## 2014-08-30 ENCOUNTER — Encounter: Payer: Self-pay | Admitting: Family Medicine

## 2014-09-03 ENCOUNTER — Ambulatory Visit: Payer: 59

## 2014-09-03 DIAGNOSIS — R5381 Other malaise: Secondary | ICD-10-CM | POA: Diagnosis not present

## 2014-09-03 DIAGNOSIS — M25512 Pain in left shoulder: Secondary | ICD-10-CM | POA: Diagnosis not present

## 2014-09-03 DIAGNOSIS — M25511 Pain in right shoulder: Secondary | ICD-10-CM | POA: Diagnosis not present

## 2014-09-05 ENCOUNTER — Encounter: Payer: 59 | Admitting: Physical Therapy

## 2014-09-05 ENCOUNTER — Ambulatory Visit: Payer: 59 | Admitting: Physical Therapy

## 2014-09-05 DIAGNOSIS — M25511 Pain in right shoulder: Secondary | ICD-10-CM | POA: Diagnosis not present

## 2014-09-11 ENCOUNTER — Ambulatory Visit: Payer: 59 | Admitting: Rehabilitation

## 2014-09-11 ENCOUNTER — Encounter: Payer: 59 | Admitting: Physical Therapy

## 2014-09-11 DIAGNOSIS — M25519 Pain in unspecified shoulder: Secondary | ICD-10-CM

## 2014-09-11 DIAGNOSIS — M25511 Pain in right shoulder: Secondary | ICD-10-CM | POA: Diagnosis not present

## 2014-09-11 NOTE — Therapy (Signed)
Physical Therapy Treatment  Patient Details  Name: Jordan [name] MRN: [id number] Date of Birth: 1951/01/23  Encounter Date: 09/11/2014      PT End of Session - 09/11/14 1024    Visit Number 4   Number of Visits 16   Date for PT Re-Evaluation 10/19/14   PT Start Time 1017   PT Stop Time 1100   PT Time Calculation (min) 43 min      Past Medical History  Diagnosis Date  . Colon polyps   . Heart disease   . Hyperlipidemia   . S/P coronary artery stent placement     Past Surgical History  Procedure Laterality Date  . Spine surgery  2001    microendoscopic discectomy  . Heart stent  2004  . Hernia repair  2004    right    There were no vitals taken for this visit.  Visit Diagnosis:  Pain in joint, shoulder region, unspecified laterality      Subjective Assessment - 09/11/14 1056    Symptoms no pain now   Limitations Lifting   Currently in Pain? No/denies   Aggravating Factors  combined abduction with internal rotation when arm is extended, sleeping on shoulder   Pain Relieving Factors not aggravating it   Multiple Pain Sites No          [facility] PT Assessment - 09/11/14 1050    AROM   Right Shoulder Internal Rotation --  reach to t-6   Left Shoulder Internal Rotation --  t-10          [facility] Adult PT Treatment/Exercise - 09/11/14 1053    Neck Exercises: Stretches   Corner Stretch 3 reps;30 seconds   Other Neck Stretches --  supine on half foam roller 2 minutes at 90/90   Neck Exercises: Machines for Strengthening   UBE (Upper Arm Bike) --  level 3.0 forward 4 min back 4 min   Shoulder Exercises: Prone   Other Prone Exercises --  hughston exercises 10 each prone over pillows          PT Education - 09/11/14 1059    Education provided Yes   Person(s) Educated Patient   Methods Handout   Comprehension Verbalized understanding          PT Short Term Goals - 09/11/14 1025    PT SHORT TERM GOAL #1   Title Be indeoendent with HEP   Time 3    Period Weeks   Status Achieved   PT SHORT TERM GOAL #2   Title return to reaching  out from the body with ADLs with < or = to 1/10 shoulder pain   Time 3   Period Weeks   Status On-going          PT Long Term Goals - 09/11/14 1027    PT LONG TERM GOAL #1   Title I with Posture   Time 8   Period Weeks   Status Achieved   PT LONG TERM GOAL #2   Title  Be independent with advanced HEP   Time 8   Period Weeks   Status On-going   PT LONG TERM GOAL #3   Title Reduce FOTO to < or= 26% limitation   Time 8   Period Weeks   Status On-going   PT LONG TERM GOAL #4   Title reach away from body with arm with no pain    Time 8   Period Weeks   Status On-going   PT  LONG TERM GOAL #5   Title sleep on rt or lt side without pain   Time 8   Period Weeks   Status On-going          Plan - 09/11/14 1040    Clinical Impression Statement STG #1 met, progressing toward STG#2, pt reports less difficulty sleeping on left shoulder, continued pain with right   PT Next Visit Plan Cont postural strength, stretching, IR         Problem List Patient Active Problem List   Diagnosis Date Noted  . Bursitis of left shoulder 07/30/2014  . Lt arm pain with moderate risk of acute coronary syndrome 04/09/2014  . CAD- LAD DES Jan 2004 08/27/2009  . HYPERCHOLESTEROLEMIA 07/04/2009                                              Dorene Ar, PTA 09/11/2014, 11:00 AM

## 2014-09-11 NOTE — Patient Instructions (Signed)
Perform hughston exercises 10 reps each 2 times per day  Flexibility: Corner Stretch   Standing in corner with hands just above shoulder level and feet 12 inches from corner or in doorway, lean forward until a comfortable stretch is felt across chest. Hold __30__ seconds. Repeat __3__ times per set. Do _1__ sets per session. Do _2___ sessions per day.  http://orth.exer.us/342   Copyright  VHI. All rights reserved.

## 2014-09-13 ENCOUNTER — Ambulatory Visit: Payer: 59

## 2014-09-13 DIAGNOSIS — M25519 Pain in unspecified shoulder: Secondary | ICD-10-CM

## 2014-09-13 DIAGNOSIS — M25511 Pain in right shoulder: Secondary | ICD-10-CM | POA: Diagnosis not present

## 2014-09-13 NOTE — Therapy (Signed)
Physical Therapy Treatment  Patient Details  Name: Laithan Conchas MRN: [id number] Date of Birth: September 28, 1951  Encounter Date: 09/13/2014      PT End of Session - 09/13/14 1309    Visit Number 5   Number of Visits 16   Date for PT Re-Evaluation 10/19/14   PT Start Time 1105   PT Stop Time 1150   PT Time Calculation (min) 45 min   Activity Tolerance Patient tolerated treatment well  no pain       Past Medical History  Diagnosis Date  . Colon polyps   . Heart disease   . Hyperlipidemia   . S/P coronary artery stent placement     Past Surgical History  Procedure Laterality Date  . Spine surgery  2001    microendoscopic discectomy  . Heart stent  2004  . Hernia repair  2004    right    There were no vitals taken for this visit.  Visit Diagnosis:  Pain in joint, shoulder region, unspecified laterality      Subjective Assessment - 09/13/14 1255    Symptoms No pain at rest. "Soreness" with lifting and L sharp pain with IR and flexion ( impingement).    Limitations Lifting   Currently in Pain? No/denies   Aggravating Factors  overhead movement, lifting, sleeping on shoulder    Pain Relieving Factors rest    Multiple Pain Sites No            [facility] Adult PT Treatment/Exercise - 09/13/14 1259    Exercises   Exercises Shoulder   Neck Exercises: Stretches   Corner Stretch --  doorway   Shoulder Exercises: Supine   Protraction Strengthening;10 reps;Other (comment)  manual resistance   Flexion AAROM;10 reps;Other (comment)  with cane, supinated, and scap retraction, supine   Modalities   Modalities Cryotherapy   Cryotherapy   Number Minutes Cryotherapy 10 Minutes   Cryotherapy Location Shoulder  Bilat   Type of Cryotherapy Ice pack   Manual Therapy   Manual Therapy Joint mobilization;Massage;Myofascial release;Passive ROM   Joint Mobilization post- inf glides bilat, GH distraction, grade I-III   Massage STM to R bicep, delt, anterior SH cross friction    Passive ROM Bilat PROM   IR limited by pain Bilat.    Shoulder Exercises: Stretch   Corner Stretch 3 reps;30 seconds  in doorway          PT Education - 09/13/14 1308    Education provided Yes   Education Details importance of posture    Person(s) Educated Patient   Methods Explanation;Demonstration;Tactile cues;Verbal cues   Comprehension Verbalized understanding              Problem List Patient Active Problem List   Diagnosis Date Noted  . Bursitis of left shoulder 07/30/2014  . Lt arm pain with moderate risk of acute coronary syndrome 04/09/2014  . CAD- LAD DES Jan 2004 08/27/2009  . HYPERCHOLESTEROLEMIA 07/04/2009                                              Dollene Cleveland , PT  09/13/2014, 1:11 PM

## 2014-09-17 ENCOUNTER — Ambulatory Visit: Payer: 59 | Admitting: Rehabilitation

## 2014-09-17 ENCOUNTER — Telehealth: Payer: Self-pay | Admitting: *Deleted

## 2014-09-17 DIAGNOSIS — M25511 Pain in right shoulder: Secondary | ICD-10-CM | POA: Diagnosis not present

## 2014-09-17 DIAGNOSIS — M25519 Pain in unspecified shoulder: Secondary | ICD-10-CM

## 2014-09-17 NOTE — Telephone Encounter (Signed)
appts made and printed...td 

## 2014-09-17 NOTE — Therapy (Signed)
Physical Therapy Treatment  Patient Details  Name: Jordan [name] MRN: [id number] Date of Birth: 1951-09-15  Encounter Date: 09/17/2014      PT End of Session - 09/17/14 1108    Visit Number 6   Number of Visits 16   Date for PT Re-Evaluation 10/19/14   PT Start Time 48   PT Stop Time 1150   PT Time Calculation (min) 48 min      Past Medical History  Diagnosis Date  . Colon polyps   . Heart disease   . Hyperlipidemia   . S/P coronary artery stent placement     Past Surgical History  Procedure Laterality Date  . Spine surgery  2001    microendoscopic discectomy  . Heart stent  2004  . Hernia repair  2004    right    There were no vitals taken for this visit.  Visit Diagnosis:  Pain in joint, shoulder region, unspecified laterality      Subjective Assessment - 09/17/14 1106    Currently in Pain? No/denies   Aggravating Factors  abduction with IR combination movement, sleeping on shoulders, overhead movement   Pain Relieving Factors rest   Multiple Pain Sites No          [facility] PT Assessment - 09/17/14 1148    AROM   Right Shoulder Internal Rotation --  reach to t-6   Left Shoulder Internal Rotation --  t-10          [facility] Adult PT Treatment/Exercise - 09/17/14 1115    Neck Exercises: Stretches   Corner Stretch --  doorway   Other Neck Stretches --  supine on half foam roller 2 minutes at 90/90   Other Neck Stretches --  chest opening horizontal on half foam roller   Neck Exercises: Machines for Strengthening   UBE (Upper Arm Bike) UBE LEvel 3 forward and back 3 min each   Shoulder Exercises: Prone   Other Prone Exercises --  hughston exercises 10 each prone over pillows   Shoulder Exercises: Standing   Horizontal ABduction Strengthening;10 reps  back to wall focusing on alignment, unable to touch head   Theraband Level (Shoulder Horizontal ABduction) Level 2 (Red)  also diagonals   External Rotation --   Theraband Level (Shoulder  External Rotation) --                Plan - 09/17/14 1204    Clinical Impression Statement Pt reports no improvement since last week. Performing most of HEP, continued poor posture and limited IR right   PT Next Visit Plan Try more IR mobs?, thoracic rotation stretch? postural strength        Problem List Patient Active Problem List   Diagnosis Date Noted  . Bursitis of left shoulder 07/30/2014  . Lt arm pain with moderate risk of acute coronary syndrome 04/09/2014  . CAD- LAD DES Jan 2004 08/27/2009  . HYPERCHOLESTEROLEMIA 07/04/2009                                              Dorene Ar, PTA 09/17/2014, 12:06 PM

## 2014-09-25 ENCOUNTER — Encounter: Payer: 59 | Admitting: Physical Therapy

## 2014-09-25 ENCOUNTER — Ambulatory Visit: Payer: 59 | Attending: Family Medicine | Admitting: Physical Therapy

## 2014-09-25 DIAGNOSIS — R5381 Other malaise: Secondary | ICD-10-CM | POA: Insufficient documentation

## 2014-09-25 DIAGNOSIS — M25512 Pain in left shoulder: Secondary | ICD-10-CM | POA: Insufficient documentation

## 2014-09-25 DIAGNOSIS — M25519 Pain in unspecified shoulder: Secondary | ICD-10-CM

## 2014-09-25 DIAGNOSIS — M25511 Pain in right shoulder: Secondary | ICD-10-CM | POA: Insufficient documentation

## 2014-09-25 NOTE — Therapy (Signed)
Physical Therapy Treatment  Patient Details  Name: Orby Tangen MRN: [id number] Date of Birth: [birth year]  Encounter Date: 09/25/2014      PT End of Session - 09/25/14 1141    Visit Number 7   Number of Visits 16   Date for PT Re-Evaluation 10/19/14   PT Start Time 1100   PT Stop Time 1139   PT Time Calculation (min) 39 min   Activity Tolerance Patient tolerated treatment well      Past Medical History  Diagnosis Date  . Colon polyps   . Heart disease   . Hyperlipidemia   . S/P coronary artery stent placement     Past Surgical History  Procedure Laterality Date  . Spine surgery  2001    microendoscopic discectomy  . Heart stent  2004  . Hernia repair  2004    right    There were no vitals taken for this visit.  Visit Diagnosis:  Pain in joint, shoulder region, unspecified laterality      Subjective Assessment - 09/25/14 1105    Symptoms L shoulder feels good, R shoulder feels the same   Limitations Lifting   Currently in Pain? No/denies   Aggravating Factors  IR, sleeping on shoulders   Pain Relieving Factors rest            [facility] Adult PT Treatment/Exercise - 09/25/14 1108    Neck Exercises: Machines for Strengthening   UBE (Upper Arm Bike) UBE level 2.5 x 8 min alt 2 min forward/2 min backwards   Shoulder Exercises: Prone   Other Prone Exercises prone Y, T, I with 2# x 15 reps each bil   Shoulder Exercises: Standing   Horizontal ABduction Strengthening;15 reps;Both;Theraband  x 2 sets   Theraband Level (Shoulder Horizontal ABduction) Level 3 (Green)   Retraction Strengthening;Both;20 reps;Theraband   Theraband Level (Shoulder Retraction) Level 4 (Blue)   Retraction Limitations mod cues for technique   Manual Therapy   Manual Therapy Joint mobilization;Massage;Myofascial release;Passive ROM   Joint Mobilization P/A and inf glides R shoulder, GH distraction grades 2-3   Shoulder Exercises: Stretch   Corner Stretch 3 reps;30 seconds   Internal Rotation Stretch 3 reps  30 seconds          PT Education - 09/25/14 1141    Education provided No          PT Short Term Goals - 09/25/14 1143    PT SHORT TERM GOAL #1   Title Be indeoendent with HEP   Time 3   Period Weeks   Status Achieved   PT SHORT TERM GOAL #2   Title return to reaching  out from the body with ADLs with < or = to 1/10 shoulder pain   Period Weeks   Status On-going          PT Long Term Goals - 09/25/14 1143    PT LONG TERM GOAL #1   Title I with Posture   Time 8   Period Weeks   Status Achieved   PT LONG TERM GOAL #2   Title  Be independent with advanced HEP   Time 8   Period Weeks   Status On-going   PT LONG TERM GOAL #3   Title Reduce FOTO to < or= 26% limitation   Time 8   Period Weeks   Status On-going   PT LONG TERM GOAL #4   Title reach away from body with arm with no pain  Time 8   Period Weeks   Status On-going          Plan - 09/25/14 1141    Clinical Impression Statement Pt reports LUE feels much better; RUE unchanged.  Improved ROM and mobility with R ir after manual therapy.   PT Next Visit Plan postural strengthening, continue manual; check appoointments and add if needed   Consulted and Agree with Plan of Care Patient        Problem List Patient Active Problem List   Diagnosis Date Noted  . Bursitis of left shoulder 07/30/2014  . Lt arm pain with moderate risk of acute coronary syndrome 04/09/2014  . CAD- LAD DES Jan 2004 08/27/2009  . HYPERCHOLESTEROLEMIA 07/04/2009                                            Laureen Abrahams, PT, DPT 09/25/2014 11:44 AM [address] [phone] (office) [phone] (fax)

## 2014-09-26 ENCOUNTER — Ambulatory Visit (INDEPENDENT_AMBULATORY_CARE_PROVIDER_SITE_OTHER)
Admission: RE | Admit: 2014-09-26 | Discharge: 2014-09-26 | Disposition: A | Discharge status: Home / Self Care | Payer: 59 | Source: Ambulatory Visit | Attending: Family Medicine | Admitting: Family Medicine

## 2014-09-26 ENCOUNTER — Other Ambulatory Visit (INDEPENDENT_AMBULATORY_CARE_PROVIDER_SITE_OTHER): Payer: 59

## 2014-09-26 ENCOUNTER — Ambulatory Visit (INDEPENDENT_AMBULATORY_CARE_PROVIDER_SITE_OTHER): Payer: 59 | Admitting: Family Medicine

## 2014-09-26 ENCOUNTER — Encounter: Payer: Self-pay | Admitting: Family Medicine

## 2014-09-26 VITALS — BP 118/64 | HR 68 | Ht 76.0 in | Wt 206.0 lb

## 2014-09-26 DIAGNOSIS — M7551 Bursitis of right shoulder: Secondary | ICD-10-CM | POA: Insufficient documentation

## 2014-09-26 DIAGNOSIS — M25511 Pain in right shoulder: Secondary | ICD-10-CM

## 2014-09-26 NOTE — Assessment & Plan Note (Signed)
Patient was given an injection today. Patient will continue with the topical anti-inflammatories if it is making improvement. Patient will finish with formal physical therapy over the course the next several weeks. We discussed continuing the icing. Patient was given different postural changes that I think also be beneficial. We will get an x-ray of patient's cervical neck to rule out any cervical radiculopathy that can be giving him some bilateral pain. If this does show any arthritis we can consider gabapentin at night. Encourage him to continue the over-the-counter medications. Patient and will come back and see me again in 4-6 weeks for further evaluation and treatment.

## 2014-09-26 NOTE — Patient Instructions (Addendum)
Good to see you.  Ice is your friend Elizebeth Koller the physical therapy or when you feel like you can do.  Tennis ball between shoulder blades with sitting Stand on wall with heels, butt, shoulder and head touching for goal of 5 minutes daily.  Continue home exercises 3 times a week.  See me again after the holidays!

## 2014-09-26 NOTE — Progress Notes (Signed)
[name] [address], [place] [address] Phone: [phone] Subjective:     CC: Shoulder pain follow up  FVC:[id number] Jordan [name] is a 63 y.o. male coming in with complaint of bilateral shoulder pain left greater than right. Patient was seen previously and was responding very well to topical anti-inflammatories, home exercises, and icing. Patient has started formal physical therapy. Patient states physical therapy has been helpful. Patient states that the continuity is difficult though. Patient states his left shoulder is doing really well but unfortunately his right shoulder seems to be a little worse. Patient states that there is some mild radiation going down towards his elbow. States that it is still with certain movements. Is starting to wake him up at night which makes him concern.   Patient 07/30/2014 did have an injection in his left shoulder.    Past medical history, social, surgical and family history all reviewed in electronic medical record.   Review of Systems: No headache, visual changes, nausea, vomiting, diarrhea, constipation, dizziness, abdominal pain, skin rash, fevers, chills, night sweats, weight loss, swollen lymph nodes, body aches, joint swelling, muscle aches, chest pain, shortness of breath, mood changes.   Objective Blood pressure 118/64, pulse 68, height 6\' 4"  (1.93 m), weight 206 lb (93.441 kg), SpO2 98 %.  General: No apparent distress alert and oriented x3 mood and affect normal, dressed appropriately.  HEENT: Pupils equal, extraocular movements intact  Respiratory: Patient's speak in full sentences and does not appear short of breath  Cardiovascular: No lower extremity edema, non tender, no erythema  Skin: Warm dry intact with no signs of infection or rash on extremities or on axial skeleton.  Abdomen: Soft nontender  Neuro: Cranial nerves II through XII are intact, neurovascularly intact in all extremities  with 2+ DTRs and 2+ pulses.  Lymph: No lymphadenopathy of posterior or anterior cervical chain or axillae bilaterally.  Gait normal with good balance and coordination.  MSK:  Non tender with full range of motion and good stability and symmetric strength and tone of  elbows, wrist, hip, knee and ankles bilaterally.  Shoulder: bilateral Inspection reveals no abnormalities, atrophy or asymmetry. Palpation is normal with no tenderness over AC joint or bicipital groove. ROM is full in all planes passively. Rotator cuff strength normal throughout. signs of impingement with positive Neer and Hawkin's tests, but negative empty can.  Impingement signs is worse on the right, this is a new finding Speeds and Yergason's tests normal. No labral pathology noted with negative Obrien's, negative clunk and good stability. Normal scapular function observed. No painful arc and no drop arm sign. No apprehension sign . Procedure: Real-time Ultrasound Guided Injection of right glenohumeral joint Device: GE Logiq E  Ultrasound guided injection is preferred based studies that show increased duration, increased effect, greater accuracy, decreased procedural pain, increased response rate with ultrasound guided versus blind injection.  Verbal informed consent obtained.  Time-out conducted.  Noted no overlying erythema, induration, or other signs of local infection.  Skin prepped in a sterile fashion.  Local anesthesia: Topical Ethyl chloride.  With sterile technique and under real time ultrasound guidance:  Joint visualized.  23g 1  inch needle inserted posterior approach. Pictures taken for needle placement. Patient did have injection of 2 cc of 1% lidocaine, 2 cc of 0.5% Marcaine, and 1.0 cc of Kenalog 40 mg/dL. Completed without difficulty  Pain immediately resolved suggesting accurate placement of the medication.  Advised to call if fevers/chills,  erythema, induration, drainage, or persistent bleeding.  Images  permanently stored and available for review in the ultrasound unit.  Impression: Technically successful ultrasound guided injection.       Impression and Recommendations:     This case required medical decision making of moderate complexity.

## 2014-09-27 ENCOUNTER — Encounter: Payer: 59 | Admitting: Physical Therapy

## 2014-10-02 ENCOUNTER — Ambulatory Visit: Payer: 59

## 2014-10-02 ENCOUNTER — Encounter: Payer: 59 | Admitting: Physical Therapy

## 2014-10-02 DIAGNOSIS — M25519 Pain in unspecified shoulder: Secondary | ICD-10-CM

## 2014-10-02 DIAGNOSIS — M25511 Pain in right shoulder: Secondary | ICD-10-CM | POA: Diagnosis not present

## 2014-10-02 NOTE — Patient Instructions (Signed)
Resisted External Rotation: in Neutral - Bilateral   Sit or stand, tubing in both hands, elbows at sides, bent to 90, forearms forward. Pinch shoulder blades together and rotate forearms out. Keep elbows at sides. Repeat __10__ times per set. Do _2___ sets per session. Do _2-3___ sessions per day.  http://orth.exer.us/966   Copyright  VHI. All rights reserved.   BALL EX: 10 reps x 3 sets, 2 times a day.   Scapular Retraction: "W" (Eccentric) - Prone (Ball)   Lie over ball or table. Quickly lift arms and pull elbows back into "W", thumbs up. Squeeze shoulder blades. Slowly lower for 3-5 seconds. Keep head in line with spine. ___ reps per set, ___ sets per day, ___ days per week. Add ___ lbs when you achieve ___ repetitions.  Copyright  VHI. All rights reserved.   Scapular Retraction: "T" (Eccentric) - Prone (Ball)   Lie over ball or table. Quickly lift arms into "T", thumbs up. Squeeze shoulder blades. Slowly lower for 3-5 seconds. Keep head in line with spine. ___ reps per set, ___ sets per day, ___ days per week. Add ___ lbs when you achieve ___ repetitions.  Copyright  VHI. All rights reserved.   Extension: "I" (Eccentric) - Prone (Ball)   Lie over ball or table. Quickly lift arms toward hips. Slowly lower for 3-5 seconds. Keep head in line with spine. ___ reps per set, ___ sets per day, ___ days per week. Add ___ lbs when you achieve ___ repetitions. Perform one-arm lift lying on bed.  Copyright  VHI. All rights reserved.    Shoulder Press: Supine with Protraction   Tubing around back, anchored in extended arms, push fists further toward ceiling from shoulder blades. Repeat 10__ times per set. Do 2__ sets per session. Do _2-3_ sessions per week.  http://tub.exer.us/291   Copyright  VHI. All rights reserved.    With theraband:  RED: 10 x 3 , 2 x a day.   Rows,  Rows with elbows straight  Standing Fly chest level  with theraband

## 2014-10-02 NOTE — Therapy (Signed)
[facility] [address] [place], [place], [address] Phone: [phone]   Fax:  [phone]  Physical Therapy Treatment  Patient Details  Name: Jordan [name] MRN: [id number] Date of Birth: 08/17/1951  Encounter Date: 10/02/2014      PT End of Session - 10/02/14 1236    Visit Number 8   Number of Visits 16   Date for PT Re-Evaluation 10/19/14   PT Start Time 1100   PT Stop Time 1150   PT Time Calculation (min) 50 min   Activity Tolerance Patient tolerated treatment well      Past Medical History  Diagnosis Date  . Colon polyps   . Heart disease   . Hyperlipidemia   . S/P coronary artery stent placement     Past Surgical History  Procedure Laterality Date  . Spine surgery  2001    microendoscopic discectomy  . Heart stent  2004  . Hernia repair  2004    right    There were no vitals taken for this visit.  Visit Diagnosis:  Pain in joint, shoulder region, unspecified laterality      Subjective Assessment - 10/02/14 1154    Symptoms R shoulder improved since cortisone shot but not 100%. Pt discussed concerns regarding poor continuity of therapists and following a program, goals, and consistent care. PT changed all following appts to her for the next 2 weeks. Discussed goals with pt and PT POC.    Limitations Lifting  reaching   Currently in Pain? No/denies   Pain Score 0-No pain  1/10 with reaching   Pain Location Shoulder   Pain Orientation Right;Left   Pain Descriptors / Indicators Aching;Patsi Sears Adult PT Treatment/Exercise - 10/02/14 0001    Shoulder Exercises: Supine   Protraction Strengthening;Both;20 reps;Weights   Protraction Weight (lbs) 2#    Shoulder Exercises: Prone   Other Prone Exercises Prone W, T, I 10 reps for HEP   Shoulder Exercises: Standing   Horizontal ABduction Strengthening;Both;20 reps;Theraband   Theraband Level (Shoulder Horizontal ABduction) Level 2 (Red)   Row  Strengthening;Both;20 reps;Theraband   Theraband Level (Shoulder Row) Level 4 (Blue)   Retraction Strengthening;Both;20 reps;Theraband   Theraband Level (Shoulder Retraction) Level 4 (Blue)   Other Standing Exercises Body blade 30 secs IR/ER, flex/ext           PT Education - 10/02/14 1236    Education provided Yes   Education Details Re-established HEP. Discussed PT POC , goals.   Person(s) Educated Patient   Methods Explanation;Demonstration;Verbal cues   Comprehension Verbalized understanding              Plan - 10/02/14 1238    Clinical Impression Statement Pt expressed concerns with consistency of therapist and PT POC. Reviewed goals, PT POC, and changed schedule to same therapist over next 2 weeks. Re-established HEP to address limiting impairments and scapular strengthening.    Pt will benefit from skilled therapeutic intervention in order to improve on the following deficits Decreased range of motion;Decreased mobility;Decreased strength;Impaired flexibility;Postural dysfunction   Rehab Potential Excellent   PT Frequency 2x / week   PT Next Visit Plan postural strengthening, continue manual; check appoointments and add if needed                               Problem List Patient Active Problem List   Diagnosis  Date Noted  . Bursitis of right shoulder 09/26/2014  . Bursitis of left shoulder 07/30/2014  . Lt arm pain with moderate risk of acute coronary syndrome 04/09/2014  . CAD- LAD DES Jan 2004 08/27/2009  . HYPERCHOLESTEROLEMIA 07/04/2009   Dollene Cleveland, PT, DPT 10/02/2014 12:44 PM Phone: [phone] Fax: [phone]

## 2014-10-04 ENCOUNTER — Encounter: Payer: 59 | Admitting: Physical Therapy

## 2014-10-04 ENCOUNTER — Ambulatory Visit: Payer: 59

## 2014-10-04 DIAGNOSIS — M25519 Pain in unspecified shoulder: Secondary | ICD-10-CM

## 2014-10-04 DIAGNOSIS — M25511 Pain in right shoulder: Secondary | ICD-10-CM | POA: Diagnosis not present

## 2014-10-04 NOTE — Therapy (Signed)
[facility] [address] [place], [place], [address] Phone: [phone]   Fax:  [phone]  Physical Therapy Treatment  Patient Details  Name: Nihar Klus MRN: [id number] Date of Birth: 24-Jan-1951  Encounter Date: 10/04/2014      PT End of Session - 10/04/14 1107    Visit Number 9   Number of Visits 16   Date for PT Re-Evaluation 10/19/14   PT Start Time 7322   PT Stop Time 1100   PT Time Calculation (min) 45 min   Equipment Utilized During Treatment Gait belt   Activity Tolerance Patient tolerated treatment well      Past Medical History  Diagnosis Date  . Colon polyps   . Heart disease   . Hyperlipidemia   . S/P coronary artery stent placement     Past Surgical History  Procedure Laterality Date  . Spine surgery  2001    microendoscopic discectomy  . Heart stent  2004  . Hernia repair  2004    right    There were no vitals taken for this visit.  Visit Diagnosis:  Pain in joint, shoulder region, unspecified laterality      Subjective Assessment - 10/04/14 1024    Symptoms Pt has some pain while sleeping on R shoulder but has no pain at present before completing exercise   Pertinent History --  Sleeping   Limitations Other (comment)   Currently in Pain? No/denies   Pain Score 0-No pain   Pain Location Shoulder   Pain Orientation Right;Left   Pain Descriptors / Indicators Aching;Sharp   Pain Relieving Factors Pt tries to utilize pillows            [facility] Adult PT Treatment/Exercise - 10/04/14 1033    Shoulder Exercises: Supine   Protraction --   Protraction Weight (lbs) --   Shoulder Exercises: Prone   Other Prone Exercises Prone W, T, I 10 reps for HEP  Verbal cues for depression and retraction   Shoulder Exercises: Standing   Horizontal ABduction --   Theraband Level (Shoulder Horizontal ABduction) --   Row Strengthening;Both;20 reps;Theraband   Theraband Level (Shoulder Row) Level 4 (Blue)   Retraction Strengthening;Both;20 reps;Theraband  retract + ext, red    Theraband Level (Shoulder Retraction) Level 4 (Blue)   Other Standing Exercises --   Other Standing Exercises Utilizing Free motion machine shld depression 10 lb  20 x  VCs and tactile for proper depression and scap engagement   Manual Therapy   Joint Mobilization AP inf glides grade 2/3 bilat GH jt          PT Education - 10/04/14 1037    Education provided Yes   Education Details Discussed posture and biomechanics of shoulder and posture for scapular muscle retraining    Person(s) Educated Patient   Methods Explanation;Demonstration;Tactile cues;Verbal cues  PT utilizing TC for inferior angle of scapula to facitliate shoulder external rotators   Comprehension Verbalized understanding;Returned demonstration;Tactile cues required;Need further instruction              Plan - 10/04/14 1034    Clinical Impression Statement Impingement pain abolished after manual therapy on L UE performed after review of HEP/return demonstration. Pt continues to require constant VC and tactile cues for scap retract and depression engagement . Pt is challenged with neuromuscular control of Bilat scap.    Pt will benefit from skilled therapeutic intervention in order to improve on the following deficits Decreased range of motion;Decreased mobility;Decreased strength;Impaired  flexibility;Postural dysfunction   Rehab Potential Excellent   PT Frequency 2x / week   PT Treatment/Interventions ADLs/Self Care Home Management;Therapeutic activities;Therapeutic exercise;Neuromuscular re-education;Manual techniques;Patient/family education   PT Next Visit Plan Assess HEP, Manual therapy to address scapulohumeral rhythm,  and use KT tape for medial and inferior scapular activation.   PT Home Exercise Plan --   Consulted and Agree with Plan of Care Patient                               Problem List Patient Active  Problem List   Diagnosis Date Noted  . Bursitis of right shoulder 09/26/2014  . Bursitis of left shoulder 07/30/2014  . Lt arm pain with moderate risk of acute coronary syndrome 04/09/2014  . CAD- LAD DES Jan 2004 08/27/2009  . HYPERCHOLESTEROLEMIA 07/04/2009   Dollene Cleveland, PT, DPT 10/04/2014 11:13 AM Phone: [phone] Fax: [phone]

## 2014-10-04 NOTE — Patient Instructions (Addendum)
Add Exercise with Marga Hoots for shoulder depression and shoulder squeezes as demonstrated in clinic.  Perform for 20 repetitions

## 2014-10-09 ENCOUNTER — Ambulatory Visit: Payer: 59

## 2014-10-09 DIAGNOSIS — M25511 Pain in right shoulder: Secondary | ICD-10-CM | POA: Diagnosis not present

## 2014-10-09 DIAGNOSIS — M25519 Pain in unspecified shoulder: Secondary | ICD-10-CM

## 2014-10-09 NOTE — Therapy (Signed)
[facility] [address] [place], [place], [address] Phone: [phone]   Fax:  [phone]  Physical Therapy Treatment  Patient Details  Name: Jordan [name] MRN: [id number] Date of Birth: June 21, 1951  Encounter Date: 10/09/2014      PT End of Session - 10/09/14 1843    Visit Number 10   Number of Visits 16   Date for PT Re-Evaluation 10/19/14   PT Start Time 1100   PT Stop Time 1145   PT Time Calculation (min) 45 min   Activity Tolerance Patient tolerated treatment well      Past Medical History  Diagnosis Date  . Colon polyps   . Heart disease   . Hyperlipidemia   . S/P coronary artery stent placement     Past Surgical History  Procedure Laterality Date  . Spine surgery  2001    microendoscopic discectomy  . Heart stent  2004  . Hernia repair  2004    right    There were no vitals taken for this visit.  Visit Diagnosis:  Pain in joint, shoulder region, unspecified laterality      Subjective Assessment - 10/09/14 1324    Symptoms Pt believes his symtpoms are improving, with less instances of pain throughout the day. Pt requests DC next visit with transition to HEP.     Limitations Other (comment)            [facility] Adult PT Treatment/Exercise - 10/09/14 0001    Shoulder Exercises: Prone   Other Prone Exercises Prone W,T,Y, I 10 x each with VCs    Manual Therapy   Manual Therapy --  NMR: KT tape to inhibit pects and activate lower trap                 Plan - 10/09/14 1843    Clinical Impression Statement Pt continues to make good progress with goals. Plan to DC next visit. Performed KT technique to activate lower trap Bilat and inhibit pects Bilat. No pain with Ys prone today. Required VCs to deactivate traps during Ys prone over ball.    Pt will benefit from skilled therapeutic intervention in order to improve on the following deficits Decreased range of motion;Decreased mobility;Decreased  strength;Impaired flexibility;Postural dysfunction   Rehab Potential Excellent   PT Frequency 2x / week   PT Treatment/Interventions ADLs/Self Care Home Management;Therapeutic activities;Therapeutic exercise;Neuromuscular re-education;Manual techniques;Patient/family education   PT Next Visit Plan Review HEP: DC. Reassess goals.    Consulted and Agree with Plan of Care Patient                               Problem List Patient Active Problem List   Diagnosis Date Noted  . Bursitis of right shoulder 09/26/2014  . Bursitis of left shoulder 07/30/2014  . Lt arm pain with moderate risk of acute coronary syndrome 04/09/2014  . CAD- LAD DES Jan 2004 08/27/2009  . HYPERCHOLESTEROLEMIA 07/04/2009    Dollene Cleveland, PT, DPT 10/09/2014 6:50 PM Phone: [phone] Fax: [phone]

## 2014-10-11 ENCOUNTER — Ambulatory Visit: Payer: 59

## 2014-10-11 DIAGNOSIS — M25511 Pain in right shoulder: Secondary | ICD-10-CM | POA: Diagnosis not present

## 2014-10-11 DIAGNOSIS — M25519 Pain in unspecified shoulder: Secondary | ICD-10-CM

## 2014-10-11 NOTE — Therapy (Addendum)
Outpatient Rehabilitation [facility] [address] [place], [place], [address] Phone: [phone]   Fax:  [phone]  Physical Therapy DIscharge  Patient Details  Name: Jordan [name] MRN: [id number] Date of Birth: June 12, 1951  Encounter Date: 10/11/2014      PT End of Session - 10/11/14 1242    Visit Number 11   Number of Visits 16   Date for PT Re-Evaluation 10/19/14   PT Start Time 1100   PT Stop Time 1145   PT Time Calculation (min) 45 min   Activity Tolerance Patient tolerated treatment well      Past Medical History  Diagnosis Date  . Colon polyps   . Heart disease   . Hyperlipidemia   . S/P coronary artery stent placement     Past Surgical History  Procedure Laterality Date  . Spine surgery  2001    microendoscopic discectomy  . Heart stent  2004  . Hernia repair  2004    right    There were no vitals taken for this visit.  Visit Diagnosis:  Pain in joint, shoulder region, unspecified laterality      Subjective Assessment - 10/11/14 1106    Symptoms Pt reports feel 80% improved since beginnign PT. Improvements are noted in less incidences of pain with less acute pain, when experienced.  Lacking 20% is attributed to pain that still occurs with reaching activites out to side.    Currently in Pain? Yes   Pain Score 0-No pain   Pain Location Shoulder   Pain Orientation Left;Right   Pain Descriptors / Indicators Aching;Sharp   Pain Type Acute pain   Pain Onset More than a month ago          [facility] PT Assessment - 10/11/14 1109    Observation/Other Assessments   Other Surveys  Select   Quick DASH  4.5%    AROM   Right Shoulder Internal Rotation --  FIR T6   Left Shoulder Internal Rotation --  T3          [facility] Adult PT Treatment/Exercise - 10/11/14 0001    Exercises   Exercises --  Plank, scapular stabilization ex, quadraped, bird-dog, SH fl    Reviewed pt's entire home program  with VCs for proper alignment and technique:  concentrating on ER at [facility] and above 70 degrees shoulder flexion in all positions.       PT Education - 10/11/14 1241    Education provided Yes   Education Details Reviewed HEP    Person(s) Educated Patient   Methods Explanation;Demonstration;Tactile cues;Verbal cues   Comprehension Verbalized understanding;Verbal cues required;Returned demonstration          PT Short Term Goals - 10/11/14 1111    PT SHORT TERM GOAL #2   Title return to reaching  out from the body with ADLs with < or = to 1/10 shoulder pain   Time 3   Period Weeks   Status Achieved          PT Long Term Goals - 10/11/14 1111    PT LONG TERM GOAL #2   Title  Be independent with advanced HEP   Time 8   Period Weeks   Status Achieved   PT LONG TERM GOAL #3   Title Reduce FOTO to < or= 26% limitation: DASH is 0% limited    Baseline Quick DASH determined 4.5% limitation.   Time 8   Period Weeks   Status Achieved   PT LONG TERM GOAL #  4   Title reach away from body with arm with no pain    Time 8   Period Weeks   Status Achieved   PT LONG TERM GOAL #5   Title sleep on rt or lt side without pain   Time 8   Period Weeks   Status Partially Met          Plan - 10/11/14 1242    Clinical Impression Statement Pt has achieved all LTGs and STGs. Therefore, pt will be discharged from PT to home program under the supervision of his trainer. Symptoms are 80% resolved and should continue to improve with scapular strengthening. Pt is agreeable to discharge and requested it.    Pt will benefit from skilled therapeutic intervention in order to improve on the following deficits Decreased range of motion;Decreased mobility;Decreased strength;Impaired flexibility;Postural dysfunction   PT Next Visit Plan DC: all goals met   Consulted and Agree with Plan of Care Patient                              Problem List Patient Active Problem List   Diagnosis Date Noted  . Bursitis of right shoulder  09/26/2014  . Bursitis of left shoulder 07/30/2014  . Lt arm pain with moderate risk of acute coronary syndrome 04/09/2014  . CAD- LAD DES Jan 2004 08/27/2009  . HYPERCHOLESTEROLEMIA 07/04/2009    PHYSICAL THERAPY DISCHARGE SUMMARY  Visits from Start of Care: 11  Current functional level related to goals / functional outcomes: All goals met: see above.    Remaining deficits: Some pain with sleeping on shoulders and occasional reaching.    Education / Equipment: [employer] and Nyoka Cowden theraband and HEP  Plan: Patient agrees to discharge.  Patient goals were met. Patient is being discharged due to meeting the stated rehab goals.  ?????        Dollene Cleveland, PT, DPT 10/11/2014 12:52 PM Phone: [phone] Fax: [phone]

## 2014-10-31 ENCOUNTER — Ambulatory Visit (INDEPENDENT_AMBULATORY_CARE_PROVIDER_SITE_OTHER): Payer: 59 | Admitting: Family Medicine

## 2014-10-31 ENCOUNTER — Encounter: Payer: Self-pay | Admitting: Family Medicine

## 2014-10-31 VITALS — BP 150/72 | HR 68 | Ht 76.0 in | Wt 203.0 lb

## 2014-10-31 DIAGNOSIS — M7552 Bursitis of left shoulder: Secondary | ICD-10-CM

## 2014-10-31 NOTE — Progress Notes (Signed)
  [facility] [place], [place] [address] Phone: [phone] Subjective:     CC: Shoulder pain follow up  FBP:[id number] Jordan [name] is a 64 y.o. male coming in with complaint of bilateral shoulder pain left greater than right. Patient has been doing formal physical therapy as well as icing protocol. Patient states that he is presently 95% better. Patient is having very minimal pain with certain movements. This is not stopping him from any activities of daily living and overall is feeling significantly better.      Past medical history, social, surgical and family history all reviewed in electronic medical record.   Review of Systems: No headache, visual changes, nausea, vomiting, diarrhea, constipation, dizziness, abdominal pain, skin rash, fevers, chills, night sweats, weight loss, swollen lymph nodes, body aches, joint swelling, muscle aches, chest pain, shortness of breath, mood changes.   Objective Blood pressure 150/72, pulse 68, height 6\' 4"  (1.93 m), weight 203 lb (92.08 kg), SpO2 98 %.  General: No apparent distress alert and oriented x3 mood and affect normal, dressed appropriately.  HEENT: Pupils equal, extraocular movements intact  Respiratory: Patient's speak in full sentences and does not appear short of breath  Cardiovascular: No lower extremity edema, non tender, no erythema  Skin: Warm dry intact with no signs of infection or rash on extremities or on axial skeleton.  Abdomen: Soft nontender  Neuro: Cranial nerves II through XII are intact, neurovascularly intact in all extremities with 2+ DTRs and 2+ pulses.  Lymph: No lymphadenopathy of posterior or anterior cervical chain or axillae bilaterally.  Gait normal with good balance and coordination.  MSK:  Non tender with full range of motion and good stability and symmetric strength and tone of  elbows, wrist, hip, knee and ankles bilaterally.  Shoulder: bilateral Inspection  reveals no abnormalities, atrophy or asymmetry. Palpation is normal with no tenderness over AC joint or bicipital groove. ROM is full in all planes passively. Rotator cuff strength normal throughout. Negative signs of impingement Speeds and Yergason's tests normal. No labral pathology noted with negative Obrien's, negative clunk and good stability. Normal scapular function observed. No painful arc and no drop arm sign. No apprehension sign .   Impression and Recommendations:     This case required medical decision making of moderate complexity.

## 2014-10-31 NOTE — Patient Instructions (Signed)
Good to see you Ice will always be your friend.  Keep doing what you are doing.  See me again when you need me.

## 2014-10-31 NOTE — Assessment & Plan Note (Signed)
Patient is doing very well with conservative therapy. I do not think another injection is necessary at this time. His lungs patient continues to improve he can come back at night as needed basis.

## 2014-11-21 ENCOUNTER — Telehealth: Payer: Self-pay | Admitting: Family Medicine

## 2014-11-21 NOTE — Telephone Encounter (Signed)
[place] [facility] - Client [place] [facility] Patient Name: Jordan [name] DOB: [birth year] Initial Comment Caller States has nasal drainage, sneezing, Nurse Assessment Nurse: Donalynn Furlong, RN, Myna Hidalgo Date/Time [name] Time): 11/21/2014 11:26:44 AM Confirm and document reason for call. If symptomatic, describe symptoms. ---Caller States has nasal drainage, sneezing, Afebrile. No N/V/D. This has been for "the last couple of days" Has the patient traveled out of the country within the last 30 days? ---No Does the patient require triage? ---Yes Related visit to physician within the last 2 weeks? ---No Does the PT have any chronic conditions? (i.e. diabetes, asthma, etc.) ---Yes List chronic conditions. ---Heart "condition", has a stent, hx of high cholesterol/being treated Guidelines Guideline Title Affirmed Question Affirmed Notes Common Cold Cold with no complications (all triage questions negative) Final Disposition User [place], RN, Myna Hidalgo

## 2014-11-21 NOTE — Telephone Encounter (Signed)
Is patient requesting to be seen? Sounds like cold or allergies.  Treat symptomatically and office follow up if worsens.

## 2014-11-21 NOTE — Telephone Encounter (Signed)
Can you call and make appt for patient tomorrow.

## 2014-11-21 NOTE — Telephone Encounter (Signed)
Pt decline an appt he states he is all set

## 2015-02-05 ENCOUNTER — Telehealth: Payer: Self-pay | Admitting: Cardiovascular Disease

## 2015-02-05 DIAGNOSIS — I2510 Atherosclerotic heart disease of native coronary artery without angina pectoris: Secondary | ICD-10-CM

## 2015-02-05 DIAGNOSIS — E785 Hyperlipidemia, unspecified: Secondary | ICD-10-CM

## 2015-02-05 NOTE — Telephone Encounter (Signed)
New message        Pt would like to know if he needs to do labs before his f/u visit in June

## 2015-02-05 NOTE — Telephone Encounter (Signed)
Yes. CMET and lipid panel

## 2015-02-05 NOTE — Telephone Encounter (Signed)
I will forward to Dr/nurse to review.

## 2015-02-06 NOTE — Telephone Encounter (Signed)
Pt aware of needed labs and appointment scheduled.

## 2015-04-01 ENCOUNTER — Other Ambulatory Visit (INDEPENDENT_AMBULATORY_CARE_PROVIDER_SITE_OTHER): Payer: 59 | Admitting: *Deleted

## 2015-04-01 DIAGNOSIS — I2510 Atherosclerotic heart disease of native coronary artery without angina pectoris: Secondary | ICD-10-CM

## 2015-04-01 DIAGNOSIS — E785 Hyperlipidemia, unspecified: Secondary | ICD-10-CM

## 2015-04-01 LAB — HEPATIC FUNCTION PANEL
ALBUMIN: 3.7 g/dL (ref 3.5–5.2)
ALK PHOS: 56 U/L (ref 39–117)
ALT: 26 U/L (ref 0–53)
AST: 25 U/L (ref 0–37)
Bilirubin, Direct: 0.2 mg/dL (ref 0.0–0.3)
TOTAL PROTEIN: 6.4 g/dL (ref 6.0–8.3)
Total Bilirubin: 0.8 mg/dL (ref 0.2–1.2)

## 2015-04-01 LAB — BASIC METABOLIC PANEL
BUN: 11 mg/dL (ref 6–23)
CHLORIDE: 106 meq/L (ref 96–112)
CO2: 27 mEq/L (ref 19–32)
Calcium: 8.9 mg/dL (ref 8.4–10.5)
Creatinine, Ser: 0.87 mg/dL (ref 0.40–1.50)
GFR: 93.85 mL/min (ref >60.00)
GLUCOSE: 98 mg/dL (ref 70–99)
POTASSIUM: 3.9 meq/L (ref 3.5–5.1)
Sodium: 138 mEq/L (ref 135–145)

## 2015-04-01 LAB — LIPID PANEL
Cholesterol: 123 mg/dL (ref 0–200)
HDL: 44.9 mg/dL (ref >39.00)
LDL Cholesterol: 65 mg/dL (ref 0–99)
NONHDL: 78.1
TRIGLYCERIDES: 65 mg/dL (ref 0.0–149.0)
Total CHOL/HDL Ratio: 3
VLDL: 13 mg/dL (ref 0.0–40.0)

## 2015-04-09 ENCOUNTER — Encounter: Payer: Self-pay | Admitting: Cardiovascular Disease

## 2015-04-09 ENCOUNTER — Ambulatory Visit (INDEPENDENT_AMBULATORY_CARE_PROVIDER_SITE_OTHER): Payer: 59 | Admitting: Cardiovascular Disease

## 2015-04-09 VITALS — BP 110/74 | HR 56 | Ht 76.0 in | Wt 206.2 lb

## 2015-04-09 DIAGNOSIS — I2510 Atherosclerotic heart disease of native coronary artery without angina pectoris: Secondary | ICD-10-CM | POA: Diagnosis not present

## 2015-04-09 NOTE — Progress Notes (Signed)
Cardiology Office Note   Date:  04/11/2015   ID:  Jordan [name] 1951-06-01, MRN [id number]  PCP:  Eulas Post, MD  Cardiologist:  Sherren Mocha, MD    Chief Complaint  Patient presents with  . Coronary Artery Disease     History of Present Illness: Jordan [name] is a 64 y.o. male who presents for follow-up evaluation.   The patient has coronary artery disease and he underwent PCI of the LAD in 2004 by Dr. Lia Foyer.  He had exertional angina at that time and was found to have subtotal occlusion of his proximal LAD. He was treated with a Cypher drug-eluting stent.  He is doing very well. Exercises regularly without exertional symptoms. Denies CP, shortness of breath, or palpitations. Compliant with medications.   Had atypical chest pain about one year ago and underwent an exercise treadmill study demonstrating good exercise tolerance and no ST-T changes.   Past Medical History  Diagnosis Date  . Colon polyps   . Heart disease   . Hyperlipidemia   . S/P coronary artery stent placement     Past Surgical History  Procedure Laterality Date  . Spine surgery  2001    microendoscopic discectomy  . Heart stent  2004  . Hernia repair  2004    right    Current Outpatient Prescriptions  Medication Sig Dispense Refill  . aspirin 81 MG tablet Take 81 mg by mouth daily. Take two daily    . CRESTOR 40 MG tablet TAKE 1 TABLET BY MOUTH AT BEDTIME 90 tablet 3  . Diclofenac Sodium 2 % SOLN Apply twice daily. 017 g 1  . folic acid (FOLVITE) 494 MCG tablet Take 400 mcg by mouth daily.    [name] ketoconazole (NIZORAL) 2 % shampoo Apply 1 application topically 2 (two) times a week.     . Multiple Vitamin (MULTIVITAMIN) capsule Take 1 capsule by mouth daily.    . nitroGLYCERIN (NITROSTAT) 0.4 MG SL tablet Place 1 tablet (0.4 mg total) under the tongue every 5 (five) minutes as needed for chest pain. 25 tablet 1  . OVER THE COUNTER MEDICATION Tumeric twice daily.     No  current facility-administered medications for this visit.    Allergies:   Review of patient's allergies indicates no known allergies.   Social History:  The patient  reports that he has never smoked. He has never used smokeless tobacco. He reports that he drinks about 1.8 oz of alcohol per week. He reports that he does not use illicit drugs.   Family History:  The patient's  family history includes Heart attack in an other family member; Heart attack (age of onset: 98) in his brother; Heart attack (age of onset: 59) in his father; Mitral valve prolapse in his mother; Stroke (age of onset: 42) in an other family member. There is no history of Colon cancer or Stomach cancer.    ROS:  Please see the history of present illness. All other systems are reviewed and negative.    PHYSICAL EXAM: VS:  BP 110/74 mmHg  Pulse 56  Ht 6\' 4"  (1.93 m)  Wt 206 lb 3.2 oz (93.532 kg)  BMI 25.11 kg/m2 , BMI Body mass index is 25.11 kg/(m^2). GEN: Well nourished, well developed, in no acute distress HEENT: normal Neck: no JVD, no masses. No carotid bruits Cardiac: RRR without murmur or gallop                Respiratory:  clear  to auscultation bilaterally, normal work of breathing GI: soft, nontender, nondistended, + BS MS: no deformity or atrophy Ext: no pretibial edema, pedal pulses 2+= bilaterally Skin: warm and dry, no rash Neuro:  Strength and sensation are intact Psych: euthymic mood, full affect  EKG:  EKG is ordered today. The ekg ordered today shows Sinus bradycardia 56 bpm, rightward axis, otherwise within normal limits.  Recent Labs: 04/01/2015: ALT 26; BUN 11; Creatinine, Ser 0.87; Potassium 3.9; Sodium 138   Lipid Panel     Component Value Date/Time   CHOL 123 04/01/2015 0826   TRIG 65.0 04/01/2015 0826   HDL 44.90 04/01/2015 0826   CHOLHDL 3 04/01/2015 0826   VLDL 13.0 04/01/2015 0826   LDLCALC 65 04/01/2015 0826      Wt Readings from Last 3 Encounters:  04/09/15 206 lb 3.2 oz  (93.532 kg)  10/31/14 203 lb (92.08 kg)  09/26/14 206 lb (93.441 kg)     ASSESSMENT AND PLAN: 1.  CAD, native vessel: stable without angina. Medical program reviewed and will be continued. Follow-up in one year with a treadmill study. Last treadmill reviewed from one year ago.  2. Hyperlipidemia: at goal on statin drug.    Current medicines are reviewed with the patient today.  The patient does not have concerns regarding medicines.  Labs/ tests ordered today include:   Orders Placed This Encounter  Procedures  . EKG 12-Lead    Disposition:   FU one year with ETT  Signed, Sherren Mocha, MD  04/11/2015 5:43 AM    [address], [place], [place]  [address] Phone: [phone]; Fax: [phone]

## 2015-04-09 NOTE — Patient Instructions (Signed)
Medication Instructions:  Your physician recommends that you continue on your current medications as directed. Please refer to the Current Medication list given to you today.  Labwork: No new orders.  Testing/Procedures: Your physician has requested that you have an exercise tolerance test in 1 YEAR with Dr Burt Knack. For further information please visit [email or web address]. Please also follow instruction sheet, as given.  Follow-Up: Your physician recommends that you schedule a follow-up appointment in: 1 YEAR for stress test    Any Other Special Instructions Will Be Listed Below (If Applicable).

## 2015-05-10 ENCOUNTER — Encounter: Payer: Self-pay | Admitting: Family Medicine

## 2015-05-10 ENCOUNTER — Other Ambulatory Visit (INDEPENDENT_AMBULATORY_CARE_PROVIDER_SITE_OTHER): Payer: 59

## 2015-05-10 ENCOUNTER — Ambulatory Visit (INDEPENDENT_AMBULATORY_CARE_PROVIDER_SITE_OTHER): Payer: 59 | Admitting: Family Medicine

## 2015-05-10 VITALS — BP 124/70 | HR 71 | Wt 207.0 lb

## 2015-05-10 DIAGNOSIS — M25511 Pain in right shoulder: Secondary | ICD-10-CM

## 2015-05-10 DIAGNOSIS — M7551 Bursitis of right shoulder: Secondary | ICD-10-CM

## 2015-05-10 DIAGNOSIS — M25512 Pain in left shoulder: Secondary | ICD-10-CM

## 2015-05-10 DIAGNOSIS — M7552 Bursitis of left shoulder: Secondary | ICD-10-CM

## 2015-05-10 NOTE — Assessment & Plan Note (Signed)
Patient was doing much better. Patient given a repeat injection today. We discussed icing regimen and home exercises. We discussed doing this on a more regular basis. Patient is going to try the topical anti-inflammatory more regularly. Patient and will come back and see me again in 3-4 weeks for further evaluation and treatment.

## 2015-05-10 NOTE — Patient Instructions (Signed)
always good to see you Jordan [name] will give you a refresher Ice 20 minutes 2 times daily. Usually after activity and before bed. Exercises 3 times a week.  Try the pennsaid twice daily as needed See me again in 4 weeks.

## 2015-05-10 NOTE — Progress Notes (Signed)
[name] [address] [place], [place] [address] Phone: [phone] Subjective:     CC: Shoulder pain follow up  TOI:[id number] Jordan [name] is a 64 y.o. male coming in with complaint of bilateral shoulder pain left greater than right. Patient has been doing formal physical therapy as well as icing protocol. Patient was near completely healed greater than 8 months ago. Patient states that he has become lackadaisical on his exercises and has noticed the pain coming back. Shoulder hurts more on the right side than the left side. Patient states though that it is starting to affect some of his daily activities. Patient states that he is not lifting as much as he was previously. Patient is also avoiding certain activities because of the pain. Patient states that the pain is starting to wake him up at night as well. States that the right arm is worse than it had ever been.       Past medical history, social, surgical and family history all reviewed in electronic medical record.   Review of Systems: No headache, visual changes, nausea, vomiting, diarrhea, constipation, dizziness, abdominal pain, skin rash, fevers, chills, night sweats, weight loss, swollen lymph nodes, body aches, joint swelling, muscle aches, chest pain, shortness of breath, mood changes.   Objective Blood pressure 124/70, pulse 71, weight 207 lb (93.895 kg), SpO2 97 %.  General: No apparent distress alert and oriented x3 mood and affect normal, dressed appropriately.  HEENT: Pupils equal, extraocular movements intact  Respiratory: Patient's speak in full sentences and does not appear short of breath  Cardiovascular: No lower extremity edema, non tender, no erythema  Skin: Warm dry intact with no signs of infection or rash on extremities or on axial skeleton.  Abdomen: Soft nontender  Neuro: Cranial nerves II through XII are intact, neurovascularly intact in all extremities with 2+ DTRs and  2+ pulses.  Lymph: No lymphadenopathy of posterior or anterior cervical chain or axillae bilaterally.  Gait normal with good balance and coordination.  MSK:  Non tender with full range of motion and good stability and symmetric strength and tone of  elbows, wrist, hip, knee and ankles bilaterally.  Shoulder: bilateral Inspection reveals no abnormalities, atrophy or asymmetry. Palpation is normal with no tenderness over AC joint or bicipital groove. ROM is full in all planes passively. Rotator cuff strength normal throughout. Positive impingement bilaterally Speeds and Yergason's tests normal. No labral pathology noted with negative Obrien's, negative clunk and good stability. Normal scapular function observed. No painful arc and no drop arm sign. No apprehension sign  Procedure: Real-time Ultrasound Guided Injection of right glenohumeral joint Device: GE Logiq E  Ultrasound guided injection is preferred based studies that show increased duration, increased effect, greater accuracy, decreased procedural pain, increased response rate with ultrasound guided versus blind injection.  Verbal informed consent obtained.  Time-out conducted.  Noted no overlying erythema, induration, or other signs of local infection.  Skin prepped in a sterile fashion.  Local anesthesia: Topical Ethyl chloride.  With sterile technique and under real time ultrasound guidance:  Joint visualized.  23g 1  inch needle inserted posterior approach. Pictures taken for needle placement. Patient did have injection of 2 cc of 1% lidocaine, 2 cc of 0.5% Marcaine, and 1.0 cc of Kenalog 40 mg/dL. Completed without difficulty  Pain immediately resolved suggesting accurate placement of the medication.  Advised to call if fevers/chills, erythema, induration, drainage, or persistent bleeding.  Images permanently stored and available for  review in the ultrasound unit.  Impression: Technically successful ultrasound guided  injection.   Procedure: Real-time Ultrasound Guided Injection of left glenohumeral joint Device: GE Logiq E  Ultrasound guided injection is preferred based studies that show increased duration, increased effect, greater accuracy, decreased procedural pain, increased response rate with ultrasound guided versus blind injection.  Verbal informed consent obtained.  Time-out conducted.  Noted no overlying erythema, induration, or other signs of local infection.  Skin prepped in a sterile fashion.  Local anesthesia: Topical Ethyl chloride.  With sterile technique and under real time ultrasound guidance:  Joint visualized.  23g 1  inch needle inserted posterior approach. Pictures taken for needle placement. Patient did have injection of 2 cc of 1% lidocaine, 2 cc of 0.5% Marcaine, and 1cc of Kenalog 40 mg/dL. Completed without difficulty  Pain immediately resolved suggesting accurate placement of the medication.  Advised to call if fevers/chills, erythema, induration, drainage, or persistent bleeding.  Images permanently stored and available for review in the ultrasound unit.  Impression: Technically successful ultrasound guided injection.  .   Impression and Recommendations:     This case required medical decision making of moderate complexity.

## 2015-05-10 NOTE — Progress Notes (Signed)
Pre visit review using our clinic review tool, if applicable. No additional management support is needed unless otherwise documented below in the visit note. 

## 2015-05-10 NOTE — Assessment & Plan Note (Signed)
As discussed above

## 2015-05-27 ENCOUNTER — Encounter: Payer: Self-pay | Admitting: Family Medicine

## 2015-05-28 ENCOUNTER — Ambulatory Visit (INDEPENDENT_AMBULATORY_CARE_PROVIDER_SITE_OTHER): Payer: 59 | Admitting: Family Medicine

## 2015-05-28 ENCOUNTER — Other Ambulatory Visit: Payer: Self-pay | Admitting: Cardiovascular Disease

## 2015-05-28 DIAGNOSIS — Z23 Encounter for immunization: Secondary | ICD-10-CM

## 2015-06-19 ENCOUNTER — Ambulatory Visit: Payer: 59 | Admitting: Family Medicine

## 2015-06-24 ENCOUNTER — Encounter: Payer: Self-pay | Admitting: Family Medicine

## 2015-06-24 ENCOUNTER — Ambulatory Visit (INDEPENDENT_AMBULATORY_CARE_PROVIDER_SITE_OTHER): Payer: 59 | Admitting: Family Medicine

## 2015-06-24 VITALS — BP 124/78 | HR 70 | Ht 76.0 in | Wt 202.0 lb

## 2015-06-24 DIAGNOSIS — M7552 Bursitis of left shoulder: Secondary | ICD-10-CM

## 2015-06-24 NOTE — Progress Notes (Signed)
  [facility] [place], [place] [address] Phone: [phone] Subjective:     CC: Shoulder pain follow up  ALP:[id number] Jordan [name] is a 64 y.o. male coming in with complaint of bilateral shoulder pain left greater than right. Patient was last seen 3-4 weeks ago and was given a repeat injection into the left arm. Patient was to start home exercises again on a regular basis. Patient and our any none formal physical therapy for this. Patient was doing topical anti-inflammatory's we discussed over-the-counter medications. Patient states overall is doing better. Patient did not do the exercises on a regular basis. We discussed home exercises, and icing. Patient went out of town for the last 2 weeks. He is out of the country and did not stick to the plan. Patient states though that is not any worse. States that nothing is affecting his daily activities. States that he has difficulty sleeping but not secondary to the pain. Some mild associated neck pain.  Patient does have x-rays of neck showing diffuse osteopenia and degenerative changes of the mild     Past medical history, social, surgical and family history all reviewed in electronic medical record.   Review of Systems: No headache, visual changes, nausea, vomiting, diarrhea, constipation, dizziness, abdominal pain, skin rash, fevers, chills, night sweats, weight loss, swollen lymph nodes, body aches, joint swelling, muscle aches, chest pain, shortness of breath, mood changes.   Objective Blood pressure 124/78, pulse 70, height 6\' 4"  (1.93 m), weight 202 lb (91.627 kg), SpO2 98 %.  General: No apparent distress alert and oriented x3 mood and affect normal, dressed appropriately.  HEENT: Pupils equal, extraocular movements intact  Respiratory: Patient's speak in full sentences and does not appear short of breath  Cardiovascular: No lower extremity edema, non tender, no erythema  Skin: Warm dry  intact with no signs of infection or rash on extremities or on axial skeleton.  Abdomen: Soft nontender  Neuro: Cranial nerves II through XII are intact, neurovascularly intact in all extremities with 2+ DTRs and 2+ pulses.  Lymph: No lymphadenopathy of posterior or anterior cervical chain or axillae bilaterally.  Gait normal with good balance and coordination.  MSK:  Non tender with full range of motion and good stability and symmetric strength and tone of  elbows, wrist, hip, knee and ankles bilaterally.  Shoulder: bilateral Inspection reveals no abnormalities, atrophy or asymmetry. Palpation is normal with no tenderness over AC joint or bicipital groove. ROM is full in all planes passively. Rotator cuff strength normal throughout. Mild impingement bilaterally Speeds and Yergason's tests normal. No labral pathology noted with negative Obrien's, negative clunk and good stability. Normal scapular function observed. No painful arc and no drop arm sign. No apprehension sign  Procedure note 97110; 15 minutes spent for Therapeutic exercises as stated in above notes.  This included exercises focusing on stretching, strengthening, with significant focus on eccentric aspects.  Shoulder Exercises that included:  Basic scapular stabilization to include adduction and depression of scapula Scaption, focusing on proper movement and good control Internal and External rotation utilizing a theraband, with elbow tucked at side entire time Rows with theraband Proper technique shown and discussed handout in great detail with ATC.  All questions were discussed and answered.     Impression and Recommendations:     This case required medical decision making of moderate complexity.

## 2015-06-24 NOTE — Patient Instructions (Signed)
Get back the horse.  I expect an e-mail in 2 weeks to me and jody and tell me how things are going. At that time we will give you an exercises prescription and change accordingly Every 2 weeks lets do that  See me in 6-8 weeks.  Ice still regularly +/- pennsaid Add melatonin 5 mg at 6-7 pm ( or 3 hours before bedtime) Avoid heavy lifting overhead and keep hands within peripheral visoin.  Continue the vitamins We will talk soon.

## 2015-06-24 NOTE — Progress Notes (Signed)
Pre visit review using our clinic review tool, if applicable. No additional management support is needed unless otherwise documented below in the visit note. 

## 2015-06-24 NOTE — Assessment & Plan Note (Signed)
Discussed with patient again at length. We discussed postural control and patient work with Product/process development scientist today. We discussed icing regimen and what activities to do one which ones to potentially avoid. Patient has completed formal physical therapy. We discussed his lungs patient does his regular regimented think he will do well. Patient will come back again in 6-8 weeks for further evaluation. Patient had numerous questions were all answered today.  Spent  25 minutes with patient face-to-face and had greater than 50% of counseling including as described above in assessment and plan.

## 2015-08-16 ENCOUNTER — Telehealth: Payer: Self-pay | Admitting: Family Medicine

## 2015-08-16 ENCOUNTER — Encounter: Payer: Self-pay | Admitting: Family Medicine

## 2015-08-16 ENCOUNTER — Ambulatory Visit (INDEPENDENT_AMBULATORY_CARE_PROVIDER_SITE_OTHER): Payer: 59 | Admitting: Family Medicine

## 2015-08-16 VITALS — BP 130/70 | HR 76 | Temp 98.3°F | Wt 209.2 lb

## 2015-08-16 DIAGNOSIS — J069 Acute upper respiratory infection, unspecified: Secondary | ICD-10-CM

## 2015-08-16 DIAGNOSIS — J020 Streptococcal pharyngitis: Secondary | ICD-10-CM

## 2015-08-16 LAB — POCT RAPID STREP A (OFFICE): Rapid Strep A Screen: NEGATIVE

## 2015-08-16 NOTE — Progress Notes (Signed)
Pre visit review using our clinic review tool, if applicable. No additional management support is needed unless otherwise documented below in the visit note. 

## 2015-08-16 NOTE — Progress Notes (Signed)
   Subjective:    Patient ID: Jordan [name], male    DOB: [birth year], 64 y.o.   MRN: [id number]  HPI Acute visit. Nonsmoker with 3 day history of sore throat, nasal congestion, and mild cough. No fever. No definite sick contacts that he has 4 grandchildren that he is round frequently. He denies a nausea, vomiting, or diarrhea. Past history of strep throat and requesting rule out strep  Past Medical History  Diagnosis Date  . Colon polyps   . Heart disease   . Hyperlipidemia   . S/P coronary artery stent placement    Past Surgical History  Procedure Laterality Date  . Spine surgery  2001    microendoscopic discectomy  . Heart stent  2004  . Hernia repair  2004    right    reports that he has never smoked. He has never used smokeless tobacco. He reports that he drinks about 1.8 oz of alcohol per week. He reports that he does not use illicit drugs. family history includes Heart attack in an other family member; Heart attack (age of onset: 51) in his brother; Heart attack (age of onset: 34) in his father; Mitral valve prolapse in his mother; Stroke (age of onset: 72) in an other family member. There is no history of Colon cancer or Stomach cancer. No Known Allergies'   Review of Systems  Constitutional: Positive for fatigue. Negative for fever and chills.  HENT: Positive for congestion and sore throat.   Respiratory: Positive for cough.        Objective:   Physical Exam  Constitutional: He appears well-developed and well-nourished.  HENT:  Right Ear: External ear normal.  Left Ear: External ear normal.  Minimal posterior pharynx erythema. No exudate.  Neck: Neck supple.  Cardiovascular: Normal rate and regular rhythm.   Pulmonary/Chest: Effort normal and breath sounds normal. No respiratory distress. He has no wheezes. He has no rales.  Lymphadenopathy:    He has no cervical adenopathy.          Assessment & Plan:  Viral URI. Rapid strep negative. Treat  symptomatically. Follow-up as needed

## 2015-08-16 NOTE — Telephone Encounter (Signed)
Send me message in my chart.

## 2015-08-16 NOTE — Addendum Note (Signed)
Addended by: Ailene Rud E on: 08/16/2015 02:23 PM   Modules accepted: Orders

## 2015-08-16 NOTE — Telephone Encounter (Signed)
This patient wants to have a phone consult with Dr. Tamala Julian. Patient does not want to come in.

## 2015-08-16 NOTE — Patient Instructions (Signed)
Viral Infections °A viral infection can be caused by different types of viruses. Most viral infections are not serious and resolve on their own. However, some infections may cause severe symptoms and may lead to further complications. °SYMPTOMS °Viruses can frequently cause: °· Minor sore throat. °· Aches and pains. °· Headaches. °· Runny nose. °· Different types of rashes. °· Watery eyes. °· Tiredness. °· Cough. °· Loss of appetite. °· Gastrointestinal infections, resulting in nausea, vomiting, and diarrhea. °These symptoms do not respond to antibiotics because the infection is not caused by bacteria. However, you might catch a bacterial infection following the viral infection. This is sometimes called a "superinfection." Symptoms of such a bacterial infection may include: °· Worsening sore throat with pus and difficulty swallowing. °· Swollen neck glands. °· Chills and a high or persistent fever. °· Severe headache. °· Tenderness over the sinuses. °· Persistent overall ill feeling (malaise), muscle aches, and tiredness (fatigue). °· Persistent cough. °· Yellow, green, or brown mucus production with coughing. °HOME CARE INSTRUCTIONS  °· Only take over-the-counter or prescription medicines for pain, discomfort, diarrhea, or fever as directed by your caregiver. °· Drink enough water and fluids to keep your urine clear or pale yellow. Sports drinks can provide valuable electrolytes, sugars, and hydration. °· Get plenty of rest and maintain proper nutrition. Soups and broths with crackers or rice are fine. °SEEK IMMEDIATE MEDICAL CARE IF:  °· You have severe headaches, shortness of breath, chest pain, neck pain, or an unusual rash. °· You have uncontrolled vomiting, diarrhea, or you are unable to keep down fluids. °· You or your child has an oral temperature above 102° F (38.9° C), not controlled by medicine. °· Your baby is older than 3 months with a rectal temperature of 102° F (38.9° C) or higher. °· Your baby is 3  months old or younger with a rectal temperature of 100.4° F (38° C) or higher. °MAKE SURE YOU:  °· Understand these instructions. °· Will watch your condition. °· Will get help right away if you are not doing well or get worse. °  °This information is not intended to replace advice given to you by your health care provider. Make sure you discuss any questions you have with your health care provider. °  °Document Released: 07/22/2005 Document Revised: 01/04/2012 Document Reviewed: 03/20/2015 °Elsevier Interactive Patient Education ©2016 [employer] ° °

## 2015-08-16 NOTE — Telephone Encounter (Signed)
Called pt back & gave him the msg. The phone call got disconnected, tried calling pt back but was unable to get him.

## 2015-09-02 ENCOUNTER — Encounter: Payer: Self-pay | Admitting: Family Medicine

## 2015-09-02 DIAGNOSIS — M25512 Pain in left shoulder: Secondary | ICD-10-CM

## 2015-09-02 DIAGNOSIS — M25511 Pain in right shoulder: Secondary | ICD-10-CM

## 2015-09-11 ENCOUNTER — Other Ambulatory Visit: Payer: Self-pay | Admitting: Cardiovascular Disease

## 2015-12-11 ENCOUNTER — Ambulatory Visit (INDEPENDENT_AMBULATORY_CARE_PROVIDER_SITE_OTHER): Payer: 59 | Admitting: Family Medicine

## 2015-12-11 ENCOUNTER — Encounter: Payer: Self-pay | Admitting: Family Medicine

## 2015-12-11 VITALS — BP 124/70 | HR 65 | Temp 98.2°F | Ht 76.0 in | Wt 214.0 lb

## 2015-12-11 DIAGNOSIS — L03116 Cellulitis of left lower limb: Secondary | ICD-10-CM

## 2015-12-11 MED ORDER — CEPHALEXIN 500 MG PO CAPS: 500.0000 mg | ORAL_CAPSULE | Freq: Four times a day (QID) | ORAL | Status: DC

## 2015-12-11 NOTE — Progress Notes (Signed)
Pre visit review using our clinic review tool, if applicable. No additional management support is needed unless otherwise documented below in the visit note. 

## 2015-12-11 NOTE — Patient Instructions (Signed)

## 2015-12-11 NOTE — Progress Notes (Signed)
   Subjective:    Patient ID: Jordan [name], male    DOB: October 20, 1951, 65 y.o.   MRN: [id number]  HPI Acute Visit Patient seen today with acute onset of left ankle swelling without known injury times two days.  Past medical history of hypercholesterolemia and CAD.  Patient reports awareness of small area of tenderness above left ankle with swelling, warmth, and redness developing by the subsequent morning. Denies pain or impairment of mobility, body aches, or fever.  No medication taken for relief. No calf pain   Review of Systems  Constitutional: Negative.   HENT: Negative.   Eyes: Negative.   Respiratory: Negative.   Cardiovascular: Negative.   Gastrointestinal: Negative.   Endocrine: Negative.   Genitourinary: Negative.   Musculoskeletal: Negative.   Skin:       See HPI  Allergic/Immunologic: Negative.   Neurological: Negative.   Hematological: Negative.   Psychiatric/Behavioral: Negative.        Objective:   Physical Exam  Constitutional: He is oriented to person, place, and time. He appears well-developed and well-nourished.  HENT:  Head: Normocephalic and atraumatic.  Right Ear: External ear normal.  Left Ear: External ear normal.  Nose: Nose normal.  Eyes: EOM are normal. Pupils are equal, round, and reactive to light.  Neck: Normal range of motion.  Cardiovascular: Normal rate, regular rhythm, normal heart sounds and intact distal pulses.   Pulmonary/Chest: Breath sounds normal.  Musculoskeletal: Normal range of motion. He exhibits edema and tenderness.  Nonpitting edema left ankle Nonpitting edema anterior left foot Full range of motion intact  Neurological: He is alert and oriented to person, place, and time. He has normal reflexes.  Skin: Skin is warm.  Left lateral lower extremity warm and erythematous. Left ankle and anterior foot very erythematous. Skin intact. Left lower extremity swelling and redness measures 16 cm X 8 cm  Nursing note and vitals  reviewed.         Assessment & Plan:  Left ankle edema/erythema. Suspect cellulitis.  He has minimal pain and slightly warm to touch. Doubt this represents gout with no prior history and essentially no pain with ambulation. No history of injury  Plan: Cephalexin 500 mg 4 times daily for 10 days. Elevate legs frequently. Consider low-dose heating pad several times daily. Follow up if condition worsens.

## 2015-12-25 ENCOUNTER — Other Ambulatory Visit (INDEPENDENT_AMBULATORY_CARE_PROVIDER_SITE_OTHER): Payer: 59

## 2015-12-25 DIAGNOSIS — Z0000 Encounter for general adult medical examination without abnormal findings: Secondary | ICD-10-CM | POA: Diagnosis not present

## 2015-12-25 LAB — HEPATIC FUNCTION PANEL
ALBUMIN: 3.9 g/dL (ref 3.5–5.2)
ALT: 26 U/L (ref 0–53)
AST: 26 U/L (ref 0–37)
Alkaline Phosphatase: 55 U/L (ref 39–117)
Bilirubin, Direct: 0.2 mg/dL (ref 0.0–0.3)
Total Bilirubin: 0.9 mg/dL (ref 0.2–1.2)
Total Protein: 6.5 g/dL (ref 6.0–8.3)

## 2015-12-25 LAB — CBC WITH DIFFERENTIAL/PLATELET
BASOS ABS: 0 10*3/uL (ref 0.0–0.1)
BASOS PCT: 0.5 % (ref 0.0–3.0)
EOS ABS: 0.1 10*3/uL (ref 0.0–0.7)
Eosinophils Relative: 2.8 % (ref 0.0–5.0)
HEMATOCRIT: 46.8 % (ref 39.0–52.0)
HEMOGLOBIN: 15.9 g/dL (ref 13.0–17.0)
LYMPHS PCT: 32.6 % (ref 12.0–46.0)
Lymphs Abs: 1.6 10*3/uL (ref 0.7–4.0)
MCHC: 34.1 g/dL (ref 30.0–36.0)
MCV: 93.3 fl (ref 78.0–100.0)
MONO ABS: 0.6 10*3/uL (ref 0.1–1.0)
Monocytes Relative: 11.9 % (ref 3.0–12.0)
NEUTROS PCT: 52.2 % (ref 43.0–77.0)
Neutro Abs: 2.6 10*3/uL (ref 1.4–7.7)
Platelets: 170 10*3/uL (ref 150.0–400.0)
RBC: 5.01 Mil/uL (ref 4.22–5.81)
RDW: 13.6 % (ref 11.5–15.5)
WBC: 5 10*3/uL (ref 4.0–10.5)

## 2015-12-25 LAB — BASIC METABOLIC PANEL
BUN: 14 mg/dL (ref 6–23)
CALCIUM: 9.4 mg/dL (ref 8.4–10.5)
CO2: 32 mEq/L (ref 19–32)
CREATININE: 0.95 mg/dL (ref 0.40–1.50)
Chloride: 104 mEq/L (ref 96–112)
GFR: 84.6 mL/min (ref >60.00)
Glucose, Bld: 90 mg/dL (ref 70–99)
Potassium: 4.6 mEq/L (ref 3.5–5.1)
Sodium: 140 mEq/L (ref 135–145)

## 2015-12-25 LAB — LIPID PANEL
CHOLESTEROL: 128 mg/dL (ref 0–200)
HDL: 44.9 mg/dL (ref >39.00)
LDL Cholesterol: 68 mg/dL (ref 0–99)
NonHDL: 83.3
Total CHOL/HDL Ratio: 3
Triglycerides: 77 mg/dL (ref 0.0–149.0)
VLDL: 15.4 mg/dL (ref 0.0–40.0)

## 2015-12-25 LAB — TSH: TSH: 2.23 u[IU]/mL (ref 0.35–4.50)

## 2015-12-25 LAB — PSA: PSA: 1.3 ng/mL (ref 0.10–4.00)

## 2016-01-03 ENCOUNTER — Encounter: Payer: 59 | Admitting: Family Medicine

## 2016-02-24 ENCOUNTER — Other Ambulatory Visit: Payer: Self-pay | Admitting: Cardiovascular Disease

## 2016-02-25 ENCOUNTER — Other Ambulatory Visit: Payer: Self-pay

## 2016-02-25 MED ORDER — ROSUVASTATIN CALCIUM 40 MG PO TABS: 40.0000 mg | ORAL_TABLET | Freq: Every day | ORAL | Status: DC

## 2016-03-11 ENCOUNTER — Ambulatory Visit (INDEPENDENT_AMBULATORY_CARE_PROVIDER_SITE_OTHER): Payer: Medicare Other | Admitting: Family Medicine

## 2016-03-11 VITALS — BP 110/70 | HR 63 | Temp 98.3°F | Ht 76.0 in | Wt 205.0 lb

## 2016-03-11 DIAGNOSIS — Z23 Encounter for immunization: Secondary | ICD-10-CM | POA: Diagnosis not present

## 2016-03-11 DIAGNOSIS — Z0000 Encounter for general adult medical examination without abnormal findings: Secondary | ICD-10-CM | POA: Diagnosis not present

## 2016-03-11 NOTE — Patient Instructions (Signed)
Follow up for any ulceration or thickening of lip soft tissue.

## 2016-03-11 NOTE — Progress Notes (Signed)
Pre visit review using our clinic review tool, if applicable. No additional management support is needed unless otherwise documented below in the visit note. 

## 2016-03-11 NOTE — Progress Notes (Signed)
Subjective:    Patient ID: Jordan [name], male    DOB: 1951-01-04, 65 y.o.   MRN: [id number]  HPI   Patient here for physical exam. He has history of CAD and has scheduled follow-up with cardiology for follow-up stress test this summer. No recent chest pains. He remains on Crestor for hyperlipidemia and also takes baby aspirin every day. He had cellulitis episode back a few months ago that has fully resolved following antibiotics.  We'll need repeat colonoscopy in a little over one year from  now. Needs Prevnar 13. Other immunizations up-to-date. He continues to be followed by dermatology yearly for general skin check ups  Past Medical History  Diagnosis Date  . Colon polyps   . Heart disease   . Hyperlipidemia   . S/P coronary artery stent placement    Past Surgical History  Procedure Laterality Date  . Spine surgery  2001    microendoscopic discectomy  . Heart stent  2004  . Hernia repair  2004    right    reports that he has never smoked. He has never used smokeless tobacco. He reports that he drinks about 1.8 oz of alcohol per week. He reports that he does not use illicit drugs. family history includes Heart attack (age of onset: 23) in his brother; Heart attack (age of onset: 49) in his father; Mitral valve prolapse in his mother. There is no history of Colon cancer or Stomach cancer. No Known Allergies    Review of Systems  Constitutional: Negative for fever, activity change, appetite change and fatigue.  HENT: Negative for congestion, ear pain and trouble swallowing.   Eyes: Negative for pain and visual disturbance.  Respiratory: Negative for cough, shortness of breath and wheezing.   Cardiovascular: Negative for chest pain and palpitations.  Gastrointestinal: Negative for nausea, vomiting, abdominal pain, diarrhea, constipation, blood in stool, abdominal distention and rectal pain.  Genitourinary: Negative for dysuria, hematuria and testicular pain.    Musculoskeletal: Negative for joint swelling and arthralgias.  Skin: Negative for rash.  Neurological: Negative for dizziness, syncope and headaches.  Hematological: Negative for adenopathy.  Psychiatric/Behavioral: Negative for confusion and dysphoric mood.       Objective:   Physical Exam  Constitutional: He is oriented to person, place, and time. He appears well-developed and well-nourished. No distress.  HENT:  Head: Normocephalic and atraumatic.  Right Ear: External ear normal.  Left Ear: External ear normal.  Mouth/Throat: Oropharynx is clear and moist.  Eyes: Conjunctivae and EOM are normal. Pupils are equal, round, and reactive to light.  Neck: Normal range of motion. Neck supple. No thyromegaly present.  Cardiovascular: Normal rate, regular rhythm and normal heart sounds.   No murmur heard. Pulmonary/Chest: No respiratory distress. He has no wheezes. He has no rales.  Abdominal: Soft. Bowel sounds are normal. He exhibits no distension and no mass. There is no tenderness. There is no rebound and no guarding.  Musculoskeletal: He exhibits no edema.  Lymphadenopathy:    He has no cervical adenopathy.  Neurological: He is alert and oriented to person, place, and time. He displays normal reflexes. No cranial nerve deficit.  Skin: No rash noted.  He has some minimal whitish discoloration lower lip but no ulceration no induration. No intraoral lesions  Psychiatric: He has a normal mood and affect.          Assessment & Plan:  Physical exam. Labs reviewed. No major concerns. Prevnar 13 given. Recommend yearly flu vaccine. Continue close  follow-up with cardiology.  Eulas Post MD [facility]

## 2016-04-17 ENCOUNTER — Ambulatory Visit (INDEPENDENT_AMBULATORY_CARE_PROVIDER_SITE_OTHER): Payer: Medicare Other

## 2016-04-17 ENCOUNTER — Ambulatory Visit: Payer: Medicare Other | Admitting: Cardiovascular Disease

## 2016-04-17 DIAGNOSIS — I2510 Atherosclerotic heart disease of native coronary artery without angina pectoris: Secondary | ICD-10-CM | POA: Diagnosis not present

## 2016-04-17 DIAGNOSIS — R9439 Abnormal result of other cardiovascular function study: Secondary | ICD-10-CM

## 2016-04-29 ENCOUNTER — Ambulatory Visit (HOSPITAL_COMMUNITY): Payer: Medicare Other | Attending: Cardiovascular Disease

## 2016-04-29 DIAGNOSIS — I2510 Atherosclerotic heart disease of native coronary artery without angina pectoris: Secondary | ICD-10-CM

## 2016-04-29 DIAGNOSIS — R9439 Abnormal result of other cardiovascular function study: Secondary | ICD-10-CM | POA: Insufficient documentation

## 2016-04-29 DIAGNOSIS — Z8249 Family history of ischemic heart disease and other diseases of the circulatory system: Secondary | ICD-10-CM | POA: Diagnosis not present

## 2016-04-29 LAB — MYOCARDIAL PERFUSION IMAGING
CHL CUP MPHR: 155 {beats}/min
CHL CUP NUCLEAR SDS: 1
CSEPEW: 13.4 METS
CSEPHR: 91 %
CSEPPHR: 142 {beats}/min
Exercise duration (min): 11 min
Exercise duration (sec): 15 s
LHR: 0.31
LV sys vol: 47 mL
LVDIAVOL: 116 mL (ref 62–150)
NUC STRESS TID: 1.01
Rest HR: 60 {beats}/min
SRS: 0
SSS: 1

## 2016-04-29 MED ORDER — TECHNETIUM TC 99M TETROFOSMIN IV KIT
31.2000 | PACK | Freq: Once | INTRAVENOUS | Status: AC | PRN
  Administered 2016-04-29: 31.2 via INTRAVENOUS
  Filled 2016-04-29: qty 31

## 2016-04-29 MED ORDER — TECHNETIUM TC 99M TETROFOSMIN IV KIT
10.1000 | PACK | Freq: Once | INTRAVENOUS | Status: AC | PRN
  Administered 2016-04-29: 10.1 via INTRAVENOUS
  Filled 2016-04-29: qty 10

## 2016-04-30 ENCOUNTER — Ambulatory Visit (INDEPENDENT_AMBULATORY_CARE_PROVIDER_SITE_OTHER): Payer: Medicare Other | Admitting: Cardiovascular Disease

## 2016-04-30 ENCOUNTER — Encounter: Payer: Self-pay | Admitting: Cardiovascular Disease

## 2016-04-30 VITALS — BP 106/52 | HR 68 | Ht 76.0 in | Wt 205.8 lb

## 2016-04-30 DIAGNOSIS — I2510 Atherosclerotic heart disease of native coronary artery without angina pectoris: Secondary | ICD-10-CM

## 2016-04-30 NOTE — Patient Instructions (Signed)

## 2016-04-30 NOTE — Progress Notes (Signed)
Cardiology Office Note Date:  04/30/2016   ID:  Hemanth, Mascioli 02-Apr-1951, MRN [id number]  PCP:  Eulas Post, MD  Cardiologist:  Sherren Mocha, MD    Chief Complaint  Patient presents with  . Coronary Artery Disease    History of Present Illness: Jordan [name] is a 65 y.o. male who presents for Follow-up evaluation. The patient has coronary artery disease with history of PCI of the LAD in 2004. At that time he was noted to have exertional angina and was found to have subtotal occlusion of the proximal LAD treated with a Cypher drug-eluting stent. The patient presented for an exercise treadmill recently and demonstrated excellent exercise capacity was no angina and normal blood pressure response. However, he had a markedly positive EKG portion of the stress test. He was referred for an exercise stress Myoview scan which again showed an abnormal EKG response, but normal perfusion and no symptoms. He presents today to review results.  He denies chest pain, shortness of breath, edema, or heart palpitations. The patient feels very well. He is physically active and exercises regularly. He has no exertional symptoms.  Past Medical History  Diagnosis Date  . Colon polyps   . Heart disease   . Hyperlipidemia   . S/P coronary artery stent placement     Past Surgical History  Procedure Laterality Date  . Spine surgery  2001    microendoscopic discectomy  . Heart stent  2004  . Hernia repair  2004    right    Current Outpatient Prescriptions  Medication Sig Dispense Refill  . aspirin 81 MG tablet Take 81 mg by mouth daily. Take two daily    . folic acid (FOLVITE) A999333 MCG tablet Take 400 mcg by mouth daily.    [name] ketoconazole (NIZORAL) 2 % shampoo Apply 1 application topically 2 (two) times a week.     . metroNIDAZOLE (METROGEL) 1 % gel Apply 1 application topically daily.    . Misc Natural Products (TART CHERRY ADVANCED PO) Take 1 capsule by mouth 2 (two) times daily.  1000mg     . Multiple Vitamin (MULTIVITAMIN) capsule Take 1 capsule by mouth daily.    . nitroGLYCERIN (NITROSTAT) 0.4 MG SL tablet Place 1 tablet (0.4 mg total) under the tongue every 5 (five) minutes as needed for chest pain. 25 tablet 1  . Omega 3-6-9 Fatty Acids (OMEGA DHA PO) Take 1 capsule by mouth daily.    . rosuvastatin (CRESTOR) 40 MG tablet Take 1 tablet (40 mg total) by mouth at bedtime. 90 tablet 3  . Turmeric 450 MG CAPS Take 1 capsule by mouth 2 (two) times daily.     No current facility-administered medications for this visit.    Allergies:   Review of patient's allergies indicates no known allergies.   Social History:  The patient  reports that he has never smoked. He has never used smokeless tobacco. He reports that he drinks about 1.8 oz of alcohol per week. He reports that he does not use illicit drugs.   Family History:  The patient's  family history includes Heart attack (age of onset: 29) in his brother; Heart attack (age of onset: 71) in his father; Mitral valve prolapse in his mother. There is no history of Colon cancer or Stomach cancer.    ROS:  Please see the history of present illness.  All other systems are reviewed and negative.   PHYSICAL EXAM: VS:  BP 106/52 mmHg  Pulse 68  Ht 6\' 4"  (1.93 m)  Wt 205 lb 12.8 oz (93.35 kg)  BMI 25.06 kg/m2 , BMI Body mass index is 25.06 kg/(m^2). GEN: Well nourished, well developed, in no acute distress HEENT: normal Neck: no JVD, no masses. No carotid bruits Cardiac: RRR without murmur or gallop                Respiratory:  clear to auscultation bilaterally, normal work of breathing GI: soft, nontender, nondistended, + BS MS: no deformity or atrophy Ext: no pretibial edema, pedal pulses 2+= bilaterally Skin: warm and dry, no rash Neuro:  Strength and sensation are intact Psych: euthymic mood, full affect  EKG:  EKG is not ordered today.  Recent Labs: 12/25/2015: ALT 26; BUN 14; Creatinine, Ser 0.95; Hemoglobin  15.9; Platelets 170.0; Potassium 4.6; Sodium 140; TSH 2.23   Lipid Panel     Component Value Date/Time   CHOL 128 12/25/2015 0842   TRIG 77.0 12/25/2015 0842   HDL 44.90 12/25/2015 0842   CHOLHDL 3 12/25/2015 0842   VLDL 15.4 12/25/2015 0842   LDLCALC 68 12/25/2015 0842      Wt Readings from Last 3 Encounters:  04/30/16 205 lb 12.8 oz (93.35 kg)  04/29/16 205 lb (92.987 kg)  03/11/16 205 lb (92.987 kg)     Cardiac Studies Reviewed: Myoview scan: Study Highlights     Nuclear stress EF: 59%.  Blood pressure demonstrated a hypertensive response to exercise.  There was 7mm of horizontal ST segment depression in the inferolateral leads at peak exercise. Changes resolved quickly in recovery.  Myocardial perfusion images are normal.  This is an intermediate risk study due to ischemic EKG changes at peak exercise.  The left ventricular ejection fraction is normal (55-65%).      ASSESSMENT AND PLAN: 1.  CAD, native vessel: The patient is stable without symptoms of angina. I have reviewed both his non-imaging exercise treadmill study as well as his exercise Myoview scan. Overall I think he is at low risk of cardiac events. He has no angina. He will current medications. I will see him back in one year.  2. Hyperlipidemia: Lipids reviewed as above. Lipids are at goal with LDL less than 70 mg/dL. Continue rosuvastatin 40 mg daily.  Current medicines are reviewed with the patient today.  The patient does not have concerns regarding medicines.  Labs/ tests ordered today include:  No orders of the defined types were placed in this encounter.   Disposition:   FU one year  Signed, Sherren Mocha, MD  04/30/2016 1:46 PM    [facility] [address], [place], [place]  [address] Phone: [phone]; Fax: [phone]

## 2016-05-11 LAB — EXERCISE TOLERANCE TEST
CSEPEDS: 2 s
CSEPEW: 11.7 METS
Exercise duration (min): 10 min
MPHR: 155 {beats}/min
Peak HR: 133 {beats}/min
Percent HR: 85 %
RPE: 15
Rest HR: 57 {beats}/min

## 2016-08-17 DIAGNOSIS — H52203 Unspecified astigmatism, bilateral: Secondary | ICD-10-CM | POA: Diagnosis not present

## 2016-08-17 DIAGNOSIS — H04123 Dry eye syndrome of bilateral lacrimal glands: Secondary | ICD-10-CM | POA: Diagnosis not present

## 2016-08-17 DIAGNOSIS — H5213 Myopia, bilateral: Secondary | ICD-10-CM | POA: Diagnosis not present

## 2016-08-17 DIAGNOSIS — H16103 Unspecified superficial keratitis, bilateral: Secondary | ICD-10-CM | POA: Diagnosis not present

## 2016-10-19 IMAGING — NM NM MISC PROCEDURE
3 series · 18 of 18 positions shown · non-contrast
Comparison: none

[Series 1: wbr_r-proj_st rest_(id)_sa · 6.5mm · 6.51mm/px · 6 of 64 frames shown]
[frame 6/64]
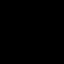
[frame 16/64]
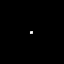
[frame 27/64]
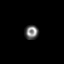
[frame 38/64]
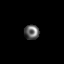
[frame 48/64]
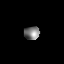
[frame 59/64]
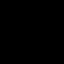

[Series 1: wbr_s-proj_st stress_(id)_sa · 6.5mm · 6.51mm/px · 6 of 64 frames shown (1 of 2)]
[frame 6/64]
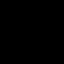
[frame 16/64]
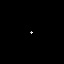
[frame 27/64]
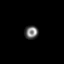
[frame 38/64]
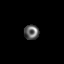
[frame 48/64]
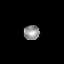
[frame 59/64]
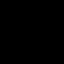

[Series 1: wbr_s-proj_st stress_(id)_sa · 6.5mm · 6.51mm/px · 6 of 512 frames shown (2 of 2)]
[frame 43/512]
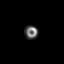
[frame 128/512]
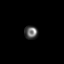
[frame 214/512]
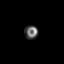
[frame 299/512]
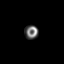
[frame 384/512]
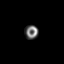
[frame 470/512]
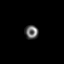

[18 of 18 positions shown; findings below may reference images not displayed]

Canned report from images found in remote index.

Refer to host system for actual result text.

## 2016-10-29 ENCOUNTER — Ambulatory Visit: Payer: Medicare Other | Admitting: Family Medicine

## 2016-10-29 DIAGNOSIS — Z955 Presence of coronary angioplasty implant and graft: Secondary | ICD-10-CM | POA: Diagnosis not present

## 2016-10-29 DIAGNOSIS — J069 Acute upper respiratory infection, unspecified: Secondary | ICD-10-CM | POA: Diagnosis not present

## 2016-10-29 DIAGNOSIS — R05 Cough: Secondary | ICD-10-CM | POA: Diagnosis not present

## 2016-10-30 ENCOUNTER — Other Ambulatory Visit: Payer: Self-pay | Admitting: *Deleted

## 2016-10-30 MED ORDER — ROSUVASTATIN CALCIUM 40 MG PO TABS: 40.0000 mg | ORAL_TABLET | Freq: Every day | ORAL | 1 refills | Status: DC

## 2016-11-02 ENCOUNTER — Telehealth: Payer: Self-pay | Admitting: Family Medicine

## 2016-11-02 NOTE — Telephone Encounter (Signed)
Yes pt needs a follow up appointment and we need records. Thanks.

## 2016-11-02 NOTE — Telephone Encounter (Signed)
Pt aware and has been scheduled.

## 2016-11-02 NOTE — Telephone Encounter (Signed)
Pt saw another provider out of our network, dx with bronchtis/ left lung pna. Pt wants to know if he should follow up with Dr Elease Hashimoto, have the other office send over xrays and notes, or go back to the other dr. Abbott Pao wants a call back to discuss these options.

## 2016-11-03 ENCOUNTER — Encounter: Payer: Self-pay | Admitting: Family Medicine

## 2016-11-03 ENCOUNTER — Ambulatory Visit (INDEPENDENT_AMBULATORY_CARE_PROVIDER_SITE_OTHER): Payer: Medicare Other | Admitting: Family Medicine

## 2016-11-03 VITALS — BP 110/70 | HR 86 | Temp 98.2°F | Ht 76.0 in | Wt 205.7 lb

## 2016-11-03 DIAGNOSIS — J069 Acute upper respiratory infection, unspecified: Secondary | ICD-10-CM | POA: Diagnosis not present

## 2016-11-03 NOTE — Patient Instructions (Signed)
Follow up for any fever or increased shortness of breath. 

## 2016-11-03 NOTE — Progress Notes (Signed)
Pre visit review using our clinic review tool, if applicable. No additional management support is needed unless otherwise documented below in the visit note. 

## 2016-11-03 NOTE — Progress Notes (Signed)
Subjective:     Patient ID: Jordan [name], male   DOB: 1950-11-17, 66 y.o.   MRN: [id number]  HPI Patient seen for follow-up regarding recent urgent care visit. He was out of town round of first the year and developed some cough and nasal congestion and increased malaise. His symptoms progressed and he went urgent care on January 4. Chest x-ray was obtained which showed apparently question of early infiltrate right lung (per treating physician). Left lung was clear. Subsequent overreading of chest x-ray per radiology was read as no acute findings. Patient was apparently given Rocephin along with intramuscular steroid and started on Zithromax. He has gradually improved since then. He states he never had any documented fever. He was not aware of any wheezing or dyspnea. His appetite is gradually improving and malaise is improving as well. Patient is a nonsmoker. No history of asthma or reactive airway disease issues.  Past Medical History:  Diagnosis Date  . Colon polyps   . Heart disease   . Hyperlipidemia   . S/P coronary artery stent placement    Past Surgical History:  Procedure Laterality Date  . heart stent  2004  . HERNIA REPAIR  2004   right  . SPINE SURGERY  2001   microendoscopic discectomy    reports that he has never smoked. He has never used smokeless tobacco. He reports that he drinks about 1.8 oz of alcohol per week . He reports that he does not use drugs. family history includes Heart attack (age of onset: 14) in his brother; Heart attack (age of onset: 16) in his father; Mitral valve prolapse in his mother. No Known Allergies   Review of Systems  Constitutional: Negative for chills and fever.  HENT: Negative for sore throat.   Respiratory: Positive for cough. Negative for shortness of breath and wheezing.   Cardiovascular: Negative for chest pain.  Neurological: Negative for dizziness and headaches.       Objective:   Physical Exam  Constitutional: He appears  well-developed and well-nourished.  HENT:  Right Ear: External ear normal.  Left Ear: External ear normal.  Mouth/Throat: Oropharynx is clear and moist.  Neck: Neck supple.  Cardiovascular: Normal rate and regular rhythm.   Pulmonary/Chest: Effort normal and breath sounds normal. No respiratory distress. He has no wheezes. He has no rales.  Lymphadenopathy:    He has no cervical adenopathy.       Assessment:     Resolving upper respiratory infection with cough. Chest x-ray apparently did not show any definitive pneumonia    Plan:     -Follow-up promptly for any fever or worsening symptoms -Suspect he will continue to gradually improve over the next several days  Eulas Post MD [facility]

## 2016-11-23 ENCOUNTER — Encounter: Payer: Self-pay | Admitting: Family Medicine

## 2017-04-12 ENCOUNTER — Other Ambulatory Visit: Payer: Self-pay | Admitting: Cardiovascular Disease

## 2017-04-13 ENCOUNTER — Encounter: Payer: Self-pay | Admitting: Family Medicine

## 2017-04-13 ENCOUNTER — Ambulatory Visit (INDEPENDENT_AMBULATORY_CARE_PROVIDER_SITE_OTHER): Payer: Medicare Other | Admitting: Family Medicine

## 2017-04-13 VITALS — BP 110/70 | HR 70 | Temp 98.2°F | Ht 75.5 in | Wt 208.7 lb

## 2017-04-13 DIAGNOSIS — L814 Other melanin hyperpigmentation: Secondary | ICD-10-CM | POA: Diagnosis not present

## 2017-04-13 DIAGNOSIS — L218 Other seborrheic dermatitis: Secondary | ICD-10-CM | POA: Diagnosis not present

## 2017-04-13 DIAGNOSIS — L820 Inflamed seborrheic keratosis: Secondary | ICD-10-CM | POA: Diagnosis not present

## 2017-04-13 DIAGNOSIS — L570 Actinic keratosis: Secondary | ICD-10-CM | POA: Diagnosis not present

## 2017-04-13 DIAGNOSIS — D225 Melanocytic nevi of trunk: Secondary | ICD-10-CM | POA: Diagnosis not present

## 2017-04-13 DIAGNOSIS — Z0000 Encounter for general adult medical examination without abnormal findings: Secondary | ICD-10-CM

## 2017-04-13 NOTE — Patient Instructions (Signed)
Consider yearly flu vaccine Consider pneumovax and shingles vaccine (Shingrix) this Fall.

## 2017-04-13 NOTE — Progress Notes (Signed)
Subjective:     Patient ID: Jordan [name], male   DOB: 1951/08/21, 66 y.o.   MRN: [id number]  HPI Patient here for complete physical. He has history of CAD and remains on aspirin and Crestor. No recent chest pains. Exercises regularly. Immunizations reviewed. He's had previous shingles vaccine as well as one-time Pneumovax and Prevnar. He has not had second Pneumovax since turning 65. No history of hepatitis C screening. Low risk. Colonoscopy up-to-date but will need repeat later this year or early 2019.  Past Medical History:  Diagnosis Date  . Colon polyps   . Heart disease   . Hyperlipidemia   . S/P coronary artery stent placement    Past Surgical History:  Procedure Laterality Date  . heart stent  2004  . HERNIA REPAIR  2004   right  . SPINE SURGERY  2001   microendoscopic discectomy    reports that he has never smoked. He has never used smokeless tobacco. He reports that he drinks about 1.8 oz of alcohol per week . He reports that he does not use drugs. family history includes Heart attack (age of onset: 49) in his brother; Heart attack (age of onset: 63) in his father; Mitral valve prolapse in his mother. No Known Allergies   Review of Systems  Constitutional: Negative for activity change, appetite change, fatigue and fever.  HENT: Negative for congestion, ear pain and trouble swallowing.   Eyes: Negative for pain and visual disturbance.  Respiratory: Negative for cough, shortness of breath and wheezing.   Cardiovascular: Negative for chest pain and palpitations.  Gastrointestinal: Negative for abdominal distention, abdominal pain, blood in stool, constipation, diarrhea, nausea, rectal pain and vomiting.  Genitourinary: Negative for dysuria, hematuria and testicular pain.  Musculoskeletal: Negative for arthralgias and joint swelling.  Skin: Negative for rash.  Neurological: Negative for dizziness, syncope and headaches.  Hematological: Negative for adenopathy.   Psychiatric/Behavioral: Negative for confusion and dysphoric mood.       Objective:   Physical Exam  Constitutional: He is oriented to person, place, and time. He appears well-developed and well-nourished. No distress.  HENT:  Head: Normocephalic and atraumatic.  Right Ear: External ear normal.  Left Ear: External ear normal.  Mouth/Throat: Oropharynx is clear and moist.  Eyes: Conjunctivae and EOM are normal. Pupils are equal, round, and reactive to light.  Neck: Normal range of motion. Neck supple. No thyromegaly present.  Cardiovascular: Normal rate, regular rhythm and normal heart sounds.   No murmur heard. Pulmonary/Chest: No respiratory distress. He has no wheezes. He has no rales.  Abdominal: Soft. Bowel sounds are normal. He exhibits no distension and no mass. There is no tenderness. There is no rebound and no guarding.  Musculoskeletal: He exhibits no edema.  Lymphadenopathy:    He has no cervical adenopathy.  Neurological: He is alert and oriented to person, place, and time. He displays normal reflexes. No cranial nerve deficit.  Skin: No rash noted.  Psychiatric: He has a normal mood and affect.       Assessment:     Physical exam. Patient has history of CAD which has been stable for several years.    Plan:     -Recommend yearly flu vaccine -Ordered future labs as he is not fasting today. Will include hepatitis C antibody screen -Continue close follow-up with cardiology -Consider Pneumovax this fall with flu vaccination -Consider booster with new shingles vaccine. He'll check on insurance coverage.  Eulas Post MD [facility]

## 2017-04-14 ENCOUNTER — Encounter: Payer: Self-pay | Admitting: Family Medicine

## 2017-04-14 ENCOUNTER — Other Ambulatory Visit (INDEPENDENT_AMBULATORY_CARE_PROVIDER_SITE_OTHER): Payer: Medicare Other

## 2017-04-14 DIAGNOSIS — Z0000 Encounter for general adult medical examination without abnormal findings: Secondary | ICD-10-CM

## 2017-04-14 LAB — CBC WITH DIFFERENTIAL/PLATELET
BASOS ABS: 0 10*3/uL (ref 0.0–0.1)
BASOS PCT: 0.6 % (ref 0.0–3.0)
EOS ABS: 0.1 10*3/uL (ref 0.0–0.7)
Eosinophils Relative: 2 % (ref 0.0–5.0)
HEMATOCRIT: 49.6 % (ref 39.0–52.0)
HEMOGLOBIN: 16.9 g/dL (ref 13.0–17.0)
LYMPHS PCT: 34 % (ref 12.0–46.0)
Lymphs Abs: 1.7 10*3/uL (ref 0.7–4.0)
MCHC: 34.1 g/dL (ref 30.0–36.0)
MCV: 95 fl (ref 78.0–100.0)
Monocytes Absolute: 0.5 10*3/uL (ref 0.1–1.0)
Monocytes Relative: 11.2 % (ref 3.0–12.0)
Neutro Abs: 2.5 10*3/uL (ref 1.4–7.7)
Neutrophils Relative %: 52.2 % (ref 43.0–77.0)
Platelets: 153 10*3/uL (ref 150.0–400.0)
RBC: 5.22 Mil/uL (ref 4.22–5.81)
RDW: 13.3 % (ref 11.5–15.5)
WBC: 4.9 10*3/uL (ref 4.0–10.5)

## 2017-04-14 LAB — HEPATIC FUNCTION PANEL
ALK PHOS: 57 U/L (ref 39–117)
ALT: 26 U/L (ref 0–53)
AST: 24 U/L (ref 0–37)
Albumin: 4 g/dL (ref 3.5–5.2)
BILIRUBIN DIRECT: 0.2 mg/dL (ref 0.0–0.3)
BILIRUBIN TOTAL: 1 mg/dL (ref 0.2–1.2)
Total Protein: 6.5 g/dL (ref 6.0–8.3)

## 2017-04-14 LAB — LIPID PANEL
CHOL/HDL RATIO: 3
CHOLESTEROL: 139 mg/dL (ref 0–200)
HDL: 45.4 mg/dL (ref >39.00)
LDL CALC: 73 mg/dL (ref 0–99)
NonHDL: 93.68
TRIGLYCERIDES: 101 mg/dL (ref 0.0–149.0)
VLDL: 20.2 mg/dL (ref 0.0–40.0)

## 2017-04-14 LAB — PSA: PSA: 1.75 ng/mL (ref 0.10–4.00)

## 2017-04-14 LAB — HEPATITIS C ANTIBODY: HCV Ab: NEGATIVE

## 2017-04-14 LAB — BASIC METABOLIC PANEL
BUN: 14 mg/dL (ref 6–23)
CHLORIDE: 104 meq/L (ref 96–112)
CO2: 31 meq/L (ref 19–32)
Calcium: 9.4 mg/dL (ref 8.4–10.5)
Creatinine, Ser: 0.93 mg/dL (ref 0.40–1.50)
GFR: 86.35 mL/min (ref >60.00)
Glucose, Bld: 94 mg/dL (ref 70–99)
POTASSIUM: 4.2 meq/L (ref 3.5–5.1)
SODIUM: 143 meq/L (ref 135–145)

## 2017-04-14 LAB — TSH: TSH: 1.67 u[IU]/mL (ref 0.35–4.50)

## 2017-04-15 ENCOUNTER — Encounter: Payer: Self-pay | Admitting: Family Medicine

## 2017-07-15 ENCOUNTER — Encounter: Payer: Self-pay | Admitting: Cardiovascular Disease

## 2017-07-15 ENCOUNTER — Encounter: Payer: Self-pay | Admitting: Family Medicine

## 2017-07-15 ENCOUNTER — Ambulatory Visit (INDEPENDENT_AMBULATORY_CARE_PROVIDER_SITE_OTHER): Payer: Medicare Other | Admitting: Cardiovascular Disease

## 2017-07-15 VITALS — BP 126/64 | HR 61 | Ht 76.0 in | Wt 205.1 lb

## 2017-07-15 DIAGNOSIS — I2510 Atherosclerotic heart disease of native coronary artery without angina pectoris: Secondary | ICD-10-CM

## 2017-07-15 NOTE — Progress Notes (Signed)
Cardiology Office Note Date:  07/15/2017   ID:  Jordan, [name] [birth year], MRN [id number]  PCP:  Eulas Post, MD  Cardiologist:  Sherren Mocha, MD    Chief Complaint  Patient presents with  . Follow-up   History of Present Illness: Jordan [name] is a 66 y.o. male who presents for Follow-up of coronary artery disease.  The patient has coronary artery disease with history of PCI of the LAD in 2004. At that time he was noted to have exertional angina and was found to have subtotal occlusion of the proximal LAD treated with a Cypher drug-eluting stent. The patient presented for an exercise treadmill in 2017 and demonstrated excellent exercise capacity was no angina and normal blood pressure response. However, he had a markedly positive EKG portion of the stress test. He was referred for an exercise stress Myoview scan which again showed an abnormal EKG response, but normal perfusion and no symptoms.   The patient is here alone today. He feels very well. He exercises regularly with no exertional chest pain, chest pressure, or left arm pain which is his previous anginal equivalent. He denies shortness of breath, edema, or heart palpitations. He is tolerating his medicines well with no complaints.  Past Medical History:  Diagnosis Date  . Colon polyps   . Heart disease   . Hyperlipidemia   . S/P coronary artery stent placement     Past Surgical History:  Procedure Laterality Date  . heart stent  2004  . HERNIA REPAIR  2004   right  . SPINE SURGERY  2001   microendoscopic discectomy    Current Outpatient Prescriptions  Medication Sig Dispense Refill  . aspirin 81 MG tablet Take 81 mg by mouth daily.     . folic acid (FOLVITE) 654 MCG tablet Take 400 mcg by mouth daily.    [name] ketoconazole (NIZORAL) 2 % shampoo Apply 1 application topically 2 (two) times a week.     . Misc Natural Products (TART CHERRY ADVANCED PO) Take 1 capsule by mouth 2 (two) times daily. 1000mg      . Multiple Vitamin (MULTIVITAMIN) capsule Take 1 capsule by mouth daily.    . nitroGLYCERIN (NITROSTAT) 0.4 MG SL tablet Place 1 tablet (0.4 mg total) under the tongue every 5 (five) minutes as needed for chest pain. 25 tablet 1  . rosuvastatin (CRESTOR) 40 MG tablet TAKE 1 TABLET BY MOUTH AT  BEDTIME 90 tablet 3  . Turmeric 450 MG CAPS Take 1 capsule by mouth 2 (two) times daily.     No current facility-administered medications for this visit.     Allergies:   Patient has no known allergies.   Social History:  The patient  reports that he has never smoked. He has never used smokeless tobacco. He reports that he drinks about 1.8 oz of alcohol per week . He reports that he does not use drugs.   Family History:  The patient's family history includes Heart attack in his unknown relative; Heart attack (age of onset: 69) in his brother; Heart attack (age of onset: 28) in his father; Mitral valve prolapse in his mother; Stroke (age of onset: 33) in his unknown relative.    ROS:  Please see the history of present illness.  All other systems are reviewed and negative.   PHYSICAL EXAM: VS:  BP 126/64   Pulse 61   Ht 6\' 4"  (1.93 m)   Wt 93 kg (205 lb 1.9 oz)  BMI 24.97 kg/m  , BMI Body mass index is 24.97 kg/m. GEN: Well nourished, well developed, in no acute distress  HEENT: normal  Neck: no JVD, no masses. No carotid bruits Cardiac: RRR without murmur or gallop                Respiratory:  clear to auscultation bilaterally, normal work of breathing GI: soft, nontender, nondistended, + BS MS: no deformity or atrophy  Ext: no pretibial edema, pedal pulses 2+= bilaterally Skin: warm and dry, no rash Neuro:  Strength and sensation are intact Psych: euthymic mood, full affect  EKG:  EKG is ordered today. The ekg ordered today shows sinus rhythm 61 bpm, occasional PVC.  Recent Labs: 04/14/2017: ALT 26; BUN 14; Creatinine, Ser 0.93; Hemoglobin 16.9; Platelets 153.0; Potassium 4.2;  Sodium 143; TSH 1.67   Lipid Panel     Component Value Date/Time   CHOL 139 04/14/2017 0918   TRIG 101.0 04/14/2017 0918   HDL 45.40 04/14/2017 0918   CHOLHDL 3 04/14/2017 0918   VLDL 20.2 04/14/2017 0918   LDLCALC 73 04/14/2017 0918      Wt Readings from Last 3 Encounters:  07/15/17 93 kg (205 lb 1.9 oz)  04/13/17 94.7 kg (208 lb 11.2 oz)  11/03/16 93.3 kg (205 lb 11.2 oz)     Cardiac Studies Reviewed: Nuclear Stress Test 04/29/2016: Study Highlights    Nuclear stress EF: 59%.  Blood pressure demonstrated a hypertensive response to exercise.  There was 67mm of horizontal ST segment depression in the inferolateral leads at peak exercise. Changes resolved quickly in recovery.  Myocardial perfusion images are normal.  This is an intermediate risk study due to ischemic EKG changes at peak exercise.  The left ventricular ejection fraction is normal (55-65%).   ASSESSMENT AND PLAN: 1.  Coronary artery disease, native vessel, without angina: Patient's most recent stress test is reviewed. His medications are reviewed and will be continued. He has an excellent functional capacity with no symptoms at this time. I will see him back in one year.  2. Hyperlipidemia: Recent lipids reviewed with LDL cholesterol at goal. Continue Crestor 40 mg daily.  Current medicines are reviewed with the patient today.  The patient does not have concerns regarding medicines.  Labs/ tests ordered today include:  No orders of the defined types were placed in this encounter.  Disposition:   FU one year  Signed, Sherren Mocha, MD  07/15/2017 4:53 PM    [facility] [address], [place], [place]  [address] Phone: [phone]; Fax: [phone]

## 2017-07-15 NOTE — Patient Instructions (Signed)

## 2017-08-25 DIAGNOSIS — H5213 Myopia, bilateral: Secondary | ICD-10-CM | POA: Diagnosis not present

## 2017-08-25 DIAGNOSIS — H524 Presbyopia: Secondary | ICD-10-CM | POA: Diagnosis not present

## 2017-08-25 DIAGNOSIS — H01001 Unspecified blepharitis right upper eyelid: Secondary | ICD-10-CM | POA: Diagnosis not present

## 2017-08-25 DIAGNOSIS — H43813 Vitreous degeneration, bilateral: Secondary | ICD-10-CM | POA: Diagnosis not present

## 2017-10-28 ENCOUNTER — Encounter: Payer: Self-pay | Admitting: Gastroenterology

## 2017-12-28 ENCOUNTER — Telehealth: Payer: Self-pay | Admitting: Cardiovascular Disease

## 2017-12-28 NOTE — Telephone Encounter (Signed)
New Message  Pt says that cooper wanted him to have an echo and labs but no order in epic. Please call

## 2017-12-28 NOTE — Telephone Encounter (Signed)
Patient called to arrange his yearly OV with Dr. Burt Knack and an echo. Reiterated to the patient his yearly is not due until September and confirmed he is not having any issues - he states he just wants to consolidate his appointments with Dr. Elease Hashimoto. Informed him Dr. Burt Knack will be asked about an echo - it is not in his office note.  He understands he will be called to let him know if Dr. Burt Knack would like an echo completed prior to his appointment.

## 2017-12-29 NOTE — Telephone Encounter (Signed)
If no symptoms would be ok to just do office visit

## 2017-12-29 NOTE — Telephone Encounter (Signed)
Patient understands no echo is needed at this time. He will call later in the summer to arrange Dr. Antionette Char office visit after he sees Dr. Elease Hashimoto.

## 2018-01-21 ENCOUNTER — Other Ambulatory Visit: Payer: Self-pay

## 2018-01-21 NOTE — Patient Outreach (Signed)
[facility]) Care Management  01/21/2018  Jordan [name] 1951-09-22 [id number]   Telephone Screen  Referral Date: 01/21/18 Referral Source: Nurse Call Center Referral Reason: "caller states he had acute sinus congestion year round, has a sore throat, small cough, nasal drainage and watering eyes, worked outside yesterday and feels maybe allergies" Insurance: HTA   Outreach attempt #  1 to patient. Spoke with patient. He is pleased to report that he is "feeling better." He states that he took the advice of the nurse he spoke with at call center. He went and got some OTC allergy relief meds and took a dose yesterday. He voices that he is feeling better since then. He denies any issues with meds or transportation. No issues for [facility] to assist with at this time. He is aware to call 24hr Nurse Line for any future needs or concerns. He voiced understanding and was appreciative of follow up call.     Plan: RN CM close at this time.    Enzo Montgomery, RN,BSN,CCM [facility] Telephonic Care Management Coordinator Direct Phone: [phone] Toll Free: [phone] Fax: [phone]

## 2018-01-31 ENCOUNTER — Ambulatory Visit (INDEPENDENT_AMBULATORY_CARE_PROVIDER_SITE_OTHER): Payer: PPO | Admitting: Family Medicine

## 2018-01-31 ENCOUNTER — Encounter: Payer: Self-pay | Admitting: Family Medicine

## 2018-01-31 VITALS — BP 120/76 | HR 67 | Temp 98.2°F | Ht 76.0 in | Wt 209.8 lb

## 2018-01-31 DIAGNOSIS — J019 Acute sinusitis, unspecified: Secondary | ICD-10-CM | POA: Diagnosis not present

## 2018-01-31 MED ORDER — AZITHROMYCIN 250 MG PO TABS: ORAL_TABLET | ORAL | 0 refills | Status: DC

## 2018-01-31 NOTE — Progress Notes (Signed)
   Subjective:    Patient ID: Jordan [name], male    DOB: [birth year], 67 y.o.   MRN: [id number]  HPI Here for 2 weeks of stuffy head, PND, ST, and a dry cough. No fever. Taking Claritin and Flonase.    Review of Systems  Constitutional: Negative.   HENT: Positive for congestion, postnasal drip, sinus pressure and sore throat. Negative for sinus pain.   Eyes: Negative.   Respiratory: Positive for cough.        Objective:   Physical Exam  Constitutional: He appears well-developed and well-nourished.  HENT:  Right Ear: External ear normal.  Left Ear: External ear normal.  Nose: Nose normal.  Mouth/Throat: Oropharynx is clear and moist.  Eyes: Conjunctivae are normal.  Neck: No thyromegaly present.  Cardiovascular: Normal rate, regular rhythm, normal heart sounds and intact distal pulses.  Pulmonary/Chest: Effort normal and breath sounds normal. No respiratory distress. He has no wheezes. He has no rales.  Lymphadenopathy:    He has no cervical adenopathy.          Assessment & Plan:  Sinusitis, treat with a Zpack.  Alysia Penna, MD

## 2018-02-10 DIAGNOSIS — D225 Melanocytic nevi of trunk: Secondary | ICD-10-CM | POA: Diagnosis not present

## 2018-02-10 DIAGNOSIS — L720 Epidermal cyst: Secondary | ICD-10-CM | POA: Diagnosis not present

## 2018-02-28 DIAGNOSIS — L570 Actinic keratosis: Secondary | ICD-10-CM | POA: Diagnosis not present

## 2018-02-28 DIAGNOSIS — D225 Melanocytic nevi of trunk: Secondary | ICD-10-CM | POA: Diagnosis not present

## 2018-02-28 DIAGNOSIS — L821 Other seborrheic keratosis: Secondary | ICD-10-CM | POA: Diagnosis not present

## 2018-02-28 DIAGNOSIS — L738 Other specified follicular disorders: Secondary | ICD-10-CM | POA: Diagnosis not present

## 2018-02-28 DIAGNOSIS — L720 Epidermal cyst: Secondary | ICD-10-CM | POA: Diagnosis not present

## 2018-02-28 DIAGNOSIS — L814 Other melanin hyperpigmentation: Secondary | ICD-10-CM | POA: Diagnosis not present

## 2018-02-28 DIAGNOSIS — L218 Other seborrheic dermatitis: Secondary | ICD-10-CM | POA: Diagnosis not present

## 2018-03-14 DIAGNOSIS — L720 Epidermal cyst: Secondary | ICD-10-CM | POA: Diagnosis not present

## 2018-03-28 ENCOUNTER — Telehealth: Payer: Self-pay | Admitting: Cardiovascular Disease

## 2018-03-28 NOTE — Telephone Encounter (Signed)
New Message    Patient is calling in reference to a follow up visit with Dr. Burt Knack. That was the only information that the patient would provide. He insist on speaking with the nurse.

## 2018-03-30 NOTE — Telephone Encounter (Signed)
Scheduled patient for one year follow-up 9/18. He was grateful for assistance.

## 2018-05-04 ENCOUNTER — Ambulatory Visit: Payer: PPO

## 2018-05-04 ENCOUNTER — Encounter: Payer: Medicare Other | Admitting: Family Medicine

## 2018-05-24 NOTE — Progress Notes (Addendum)
Subjective:   Jordan [name] is a 67 y.o. male who presents for Medicare Annual/Subsequent preventive examination.  Reports health as good   Diet Chol/hdl 3; trig 101 Vegetables and fruits  80 % complaint with [place]  (dad had MI; brother survived an MI )  Limited carbs  Exercise Work out x 2 week with a Curator 5/ 7  Reactive exerciser;  Shoulder exercises Treadmill in the home   Non exercise  Travels a lot  3 dtrs and 4+ on the way     Health Maintenance Due  Topic Date Due  . PNA vac Low Risk Adult (2 of 2 - PPSV23) 08/30/2017  . COLONOSCOPY  10/05/2017   PSA 03/2017 Colonoscopy 09/2012  Repeat 09/2017 Dr. Elease Hashimoto to order this  Due PSV 23 and taken today   Cardiac Risk Factors include: advanced age (>61men, >63 women);dyslipidemia;hypertension;male gender     Objective:    Vitals: BP 102/64   Ht 6\' 3"  (1.905 m)   Wt 208 lb (94.3 kg)   BMI 26.00 kg/m   Body mass index is 26 kg/m.  Advanced Directives 05/25/2018 10/04/2014 09/13/2014  Does Patient Have a Medical Advance Directive? Yes No No    Tobacco Social History   Tobacco Use  Smoking Status Never Smoker  Smokeless Tobacco Never Used     Counseling given: Yes   Clinical Intake:    Past Medical History:  Diagnosis Date  . Colon polyps   . Heart disease   . Hyperlipidemia   . S/P coronary artery stent placement    Past Surgical History:  Procedure Laterality Date  . heart stent  2004  . HERNIA REPAIR  2004   right  . SPINE SURGERY  2001   microendoscopic discectomy   Family History  Problem Relation Age of Onset  . Heart attack Father 58       deceased  . Mitral valve prolapse Mother   . Heart attack Brother 98  . Heart attack Unknown        mothers dad died at 80 of his third heart attack/two uncles who died age 74-58  . Stroke Unknown 75       uncle   . Colon cancer Neg Hx   . Stomach cancer Neg Hx    Social History   Socioeconomic History  .  Marital status: Married    Spouse name: Not on file  . Number of children: Not on file  . Years of education: Not on file  . Highest education level: Not on file  Occupational History  . Not on file  Social Needs  . Financial resource strain: Not on file  . Food insecurity:    Worry: Not on file    Inability: Not on file  . Transportation needs:    Medical: Not on file    Non-medical: Not on file  Tobacco Use  . Smoking status: Never Smoker  . Smokeless tobacco: Never Used  Substance and Sexual Activity  . Alcohol use: Yes    Alcohol/week: 1.8 oz    Types: 3 Cans of beer per week  . Drug use: No  . Sexual activity: Not on file  Lifestyle  . Physical activity:    Days per week: Not on file    Minutes per session: Not on file  . Stress: Not on file  Relationships  . Social connections:    Talks on phone: Not on file    Gets together: Not  on file    Attends religious service: Not on file    Active member of club or organization: Not on file    Attends meetings of clubs or organizations: Not on file    Relationship status: Not on file  Other Topics Concern  . Not on file  Social History Narrative  . Not on file    Outpatient Encounter Medications as of 05/25/2018  Medication Sig  . folic acid (FOLVITE) 270 MCG tablet Take 400 mcg by mouth daily.  [name] ketoconazole (NIZORAL) 2 % shampoo Apply 1 application topically 2 (two) times a week.   . Misc Natural Products (TART CHERRY ADVANCED PO) Take 1 capsule by mouth 2 (two) times daily. 1000mg   . Multiple Vitamin (MULTIVITAMIN) capsule Take 1 capsule by mouth daily.  . nitroGLYCERIN (NITROSTAT) 0.4 MG SL tablet Place 1 tablet (0.4 mg total) under the tongue every 5 (five) minutes as needed for chest pain.  . rosuvastatin (CRESTOR) 40 MG tablet TAKE 1 TABLET BY MOUTH AT  BEDTIME  . Turmeric 450 MG CAPS Take 1 capsule by mouth 2 (two) times daily.  . [DISCONTINUED] aspirin 81 MG tablet Take 81 mg by mouth daily.   .  [DISCONTINUED] azithromycin (ZITHROMAX) 250 MG tablet As directed  . [DISCONTINUED] loratadine (CLARITIN) 10 MG tablet Take 5 mg by mouth daily.  . [DISCONTINUED] triamcinolone (NASACORT ALLERGY 24HR) 55 MCG/ACT AERO nasal inhaler Place 2 sprays into the nose daily.   No facility-administered encounter medications on file as of 05/25/2018.     Activities of Daily Living In your present state of health, do you have any difficulty performing the following activities: 05/25/2018  Hearing? N  Vision? N  Difficulty concentrating or making decisions? N  Walking or climbing stairs? N  Dressing or bathing? N  Doing errands, shopping? N  Preparing Food and eating ? N  Using the Toilet? N  In the past six months, have you accidently leaked urine? N  Do you have problems with loss of bowel control? N  Managing your Medications? N  Managing your Finances? N  Housekeeping or managing your Housekeeping? N  Some recent data might be hidden    Patient Care Team: Eulas Post, MD as PCP - General (Family Medicine)   Assessment:   This is a routine wellness examination for Lauri.  Exercise Activities and Dietary recommendations Current Exercise Habits: Home exercise routine, Type of exercise: strength training/weights;walking;stretching;treadmill, Time (Minutes): 45, Frequency (Times/Week): 5, Weekly Exercise (Minutes/Week): 225, Intensity: Moderate  Goals    . Patient Stated     To maintain and comply with the plan !       Fall Risk Fall Risk  05/25/2018 05/25/2018 04/13/2017 03/11/2016 03/11/2016  Falls in the past year? No No No No No     Depression Screen PHQ 2/9 Scores 05/25/2018 05/25/2018 04/13/2017 03/11/2016  PHQ - 2 Score 3 0 0 0    Cognitive Function MMSE - Mini Mental State Exam 05/25/2018  Not completed: (No Data)     Ad8 score reviewed for issues:  Issues making decisions:  Less interest in hobbies / activities:  Repeats questions, stories (family  complaining):  Trouble using ordinary gadgets (microwave, computer, phone):  Forgets the month or year:   Mismanaging finances:   Remembering appts:  Daily problems with thinking and/or memory: Ad8 score is=0 Recall 0      Immunization History  Administered Date(s) Administered  . Hepatitis A, Adult 05/28/2015  . Influenza Split 08/30/2012  .  Pneumococcal Conjugate-13 03/11/2016  . Pneumococcal Polysaccharide-23 08/30/2012, 05/25/2018  . Td 02/24/2012  . Zoster 08/30/2012     Screening Tests Health Maintenance  Topic Date Due  . PNA vac Low Risk Adult (2 of 2 - PPSV23) 08/30/2017  . COLONOSCOPY  10/05/2017  . INFLUENZA VACCINE  05/26/2018  . TETANUS/TDAP  02/23/2022  . Hepatitis C Screening  Completed       Plan:      PCP Notes   Health Maintenance PSA 03/2017 Colonoscopy 09/2012  Repeat 09/2017 Dr. Elease Hashimoto to follow up per the patient Due PSV 23 and taken today   Educated regarding shingrix   Abnormal Screens  none  Referrals  To GI per Dr. Elease Hashimoto   Patient concerns; None    Nurse Concerns; As noted  Next PCP apt Was seen today      I have personally reviewed and noted the following in the patient's chart:   . Medical and social history . Use of alcohol, tobacco or illicit drugs  . Current medications and supplements . Functional ability and status . Nutritional status . Physical activity . Advanced directives . List of other physicians . Hospitalizations, surgeries, and ER visits in previous 12 months . Vitals . Screenings to include cognitive, depression, and falls . Referrals and appointments  In addition, I have reviewed and discussed with patient certain preventive protocols, quality metrics, and best practice recommendations. A written personalized care plan for preventive services as well as general preventive health recommendations were provided to patient.     SVXBL,[email or web address], RN  05/25/2018  I have reviewed the  documentation for the AWV and [place] provided by the health coach and agree with their documentation. I was immediately available for any questions  Eulas Post MD [facility] at [facility]

## 2018-05-25 ENCOUNTER — Ambulatory Visit (INDEPENDENT_AMBULATORY_CARE_PROVIDER_SITE_OTHER): Payer: PPO | Admitting: Family Medicine

## 2018-05-25 ENCOUNTER — Encounter: Payer: Self-pay | Admitting: Family Medicine

## 2018-05-25 ENCOUNTER — Ambulatory Visit (INDEPENDENT_AMBULATORY_CARE_PROVIDER_SITE_OTHER): Payer: PPO

## 2018-05-25 ENCOUNTER — Telehealth: Payer: Self-pay | Admitting: Family Medicine

## 2018-05-25 VITALS — BP 102/64 | Ht 75.0 in | Wt 208.0 lb

## 2018-05-25 VITALS — BP 102/64 | HR 83 | Temp 97.7°F | Ht 75.75 in | Wt 208.3 lb

## 2018-05-25 DIAGNOSIS — R972 Elevated prostate specific antigen [PSA]: Secondary | ICD-10-CM | POA: Diagnosis not present

## 2018-05-25 DIAGNOSIS — I2583 Coronary atherosclerosis due to lipid rich plaque: Secondary | ICD-10-CM

## 2018-05-25 DIAGNOSIS — I2510 Atherosclerotic heart disease of native coronary artery without angina pectoris: Secondary | ICD-10-CM | POA: Diagnosis not present

## 2018-05-25 DIAGNOSIS — Z0000 Encounter for general adult medical examination without abnormal findings: Secondary | ICD-10-CM

## 2018-05-25 DIAGNOSIS — E7800 Pure hypercholesterolemia, unspecified: Secondary | ICD-10-CM

## 2018-05-25 DIAGNOSIS — D126 Benign neoplasm of colon, unspecified: Secondary | ICD-10-CM

## 2018-05-25 DIAGNOSIS — Z23 Encounter for immunization: Secondary | ICD-10-CM

## 2018-05-25 LAB — BASIC METABOLIC PANEL
BUN: 13 mg/dL (ref 6–23)
CALCIUM: 9.3 mg/dL (ref 8.4–10.5)
CO2: 31 mEq/L (ref 19–32)
Chloride: 103 mEq/L (ref 96–112)
Creatinine, Ser: 0.9 mg/dL (ref 0.40–1.50)
GFR: 89.38 mL/min (ref >60.00)
GLUCOSE: 85 mg/dL (ref 70–99)
Potassium: 4 mEq/L (ref 3.5–5.1)
SODIUM: 142 meq/L (ref 135–145)

## 2018-05-25 LAB — CBC WITH DIFFERENTIAL/PLATELET
BASOS ABS: 0 10*3/uL (ref 0.0–0.1)
Basophils Relative: 1.2 % (ref 0.0–3.0)
Eosinophils Absolute: 0.1 10*3/uL (ref 0.0–0.7)
Eosinophils Relative: 2.5 % (ref 0.0–5.0)
HCT: 47 % (ref 39.0–52.0)
Hemoglobin: 16.3 g/dL (ref 13.0–17.0)
LYMPHS ABS: 1.2 10*3/uL (ref 0.7–4.0)
Lymphocytes Relative: 30.5 % (ref 12.0–46.0)
MCHC: 34.6 g/dL (ref 30.0–36.0)
MCV: 93.8 fl (ref 78.0–100.0)
MONOS PCT: 9 % (ref 3.0–12.0)
Monocytes Absolute: 0.4 10*3/uL (ref 0.1–1.0)
NEUTROS ABS: 2.3 10*3/uL (ref 1.4–7.7)
NEUTROS PCT: 56.8 % (ref 43.0–77.0)
PLATELETS: 149 10*3/uL — AB (ref 150.0–400.0)
RBC: 5.02 Mil/uL (ref 4.22–5.81)
RDW: 13 % (ref 11.5–15.5)
WBC: 4.1 10*3/uL (ref 4.0–10.5)

## 2018-05-25 LAB — TSH: TSH: 1.96 u[IU]/mL (ref 0.35–4.50)

## 2018-05-25 LAB — PSA, MEDICARE: PSA: 2.49 ng/ml (ref 0.10–4.00)

## 2018-05-25 LAB — LIPID PANEL
CHOL/HDL RATIO: 3
Cholesterol: 127 mg/dL (ref 0–200)
HDL: 44.8 mg/dL (ref >39.00)
LDL CALC: 66 mg/dL (ref 0–99)
NONHDL: 82.3
TRIGLYCERIDES: 80 mg/dL (ref 0.0–149.0)
VLDL: 16 mg/dL (ref 0.0–40.0)

## 2018-05-25 LAB — HEPATIC FUNCTION PANEL
ALK PHOS: 58 U/L (ref 39–117)
ALT: 27 U/L (ref 0–53)
AST: 28 U/L (ref 0–37)
Albumin: 3.9 g/dL (ref 3.5–5.2)
BILIRUBIN DIRECT: 0.2 mg/dL (ref 0.0–0.3)
TOTAL PROTEIN: 6.5 g/dL (ref 6.0–8.3)
Total Bilirubin: 0.9 mg/dL (ref 0.2–1.2)

## 2018-05-25 NOTE — Progress Notes (Signed)
Subjective:     Patient ID: Jordan [name], male   DOB: 1951-10-23, 67 y.o.   MRN: [id number]  HPI Patient seen for physical exam. He also plans to follow-up with our health coach for Medicare wellness visit. He has remote history of heart disease with stent placement back in 2004. Hyperlipidemia treated with Crestor. Generally feels well. He is retired. Travels frequently for pleasure and golf. Smokes about 20 cigars per year. No history of cigarette smoking.  Health maintenance issues addressed  -Needs repeat colonoscopy. He had: Adenoma 2013 -Needs Pneumovax. He had Prevnar 13 after 65 but no history of Pneumovax after turning 65 -Had previous Zostavax 2013 -Tetanus up-to-date -Previous hepatitis C screen negative  Exercises some but somewhat inconsistent. No recent chest pains.  Past Medical History:  Diagnosis Date  . Colon polyps   . Heart disease   . Hyperlipidemia   . S/P coronary artery stent placement    Past Surgical History:  Procedure Laterality Date  . heart stent  2004  . HERNIA REPAIR  2004   right  . SPINE SURGERY  2001   microendoscopic discectomy    reports that he has never smoked. He has never used smokeless tobacco. He reports that he drinks about 1.8 oz of alcohol per week. He reports that he does not use drugs. family history includes Heart attack in his unknown relative; Heart attack (age of onset: 50) in his brother; Heart attack (age of onset: 30) in his father; Mitral valve prolapse in his mother; Stroke (age of onset: 91) in his unknown relative. No Known Allergies   Review of Systems  Constitutional: Negative for activity change, appetite change, fatigue and fever.  HENT: Negative for congestion, ear pain and trouble swallowing.   Eyes: Negative for pain and visual disturbance.  Respiratory: Negative for cough, shortness of breath and wheezing.   Cardiovascular: Negative for chest pain and palpitations.  Gastrointestinal: Negative for  abdominal distention, abdominal pain, blood in stool, constipation, diarrhea, nausea, rectal pain and vomiting.  Genitourinary: Negative for dysuria, hematuria and testicular pain.  Musculoskeletal: Negative for arthralgias and joint swelling.  Skin: Negative for rash.  Neurological: Negative for dizziness, syncope and headaches.  Hematological: Negative for adenopathy.  Psychiatric/Behavioral: Negative for confusion and dysphoric mood.       Objective:   Physical Exam  Constitutional: He is oriented to person, place, and time. He appears well-developed and well-nourished. No distress.  HENT:  Head: Normocephalic and atraumatic.  Right Ear: External ear normal.  Left Ear: External ear normal.  Mouth/Throat: Oropharynx is clear and moist.  Eyes: Pupils are equal, round, and reactive to light. Conjunctivae and EOM are normal.  Neck: Normal range of motion. Neck supple. No thyromegaly present.  Cardiovascular: Normal rate, regular rhythm and normal heart sounds.  No murmur heard. Pulmonary/Chest: No respiratory distress. He has no wheezes. He has no rales.  Abdominal: Soft. Bowel sounds are normal. He exhibits no distension and no mass. There is no tenderness. There is no rebound and no guarding.  Musculoskeletal: He exhibits no edema.  Lymphadenopathy:    He has no cervical adenopathy.  Neurological: He is alert and oriented to person, place, and time. He displays normal reflexes. No cranial nerve deficit.  Skin: No rash noted.  Psychiatric: He has a normal mood and affect.       Assessment:     Physical exam. Patient has history of coronary artery disease and has done well for the past several years.  History of hyperlipidemia on Crestor. We discussed following issues    Plan:     -Set up repeat colonoscopy with prior history of colon adenoma 12/13 -Pneumovax given -He will check on coverage for new shingles vaccine -Obtain follow-up labs with lipid and hepatic panel with  Crestor therapy -The natural history of prostate cancer and ongoing controversy regarding screening and potential treatment outcomes of prostate cancer has been discussed with the patient. The meaning of a false positive PSA and a false negative PSA has been discussed. He indicates understanding of the limitations of this screening test and wishes to proceed with screening PSA testing.  Eulas Post MD [facility] [place]

## 2018-05-25 NOTE — Telephone Encounter (Signed)
Left message on machine for patient to return our  Call.  CRM created

## 2018-05-25 NOTE — Telephone Encounter (Signed)
Yes.  Motrin should be OK.  Could also try some icing for any swelling and pain.

## 2018-05-25 NOTE — Patient Instructions (Addendum)
Jordan [name] , Thank you for taking time to come for your Medicare Wellness Visit. I appreciate your ongoing commitment to your health goals. Please review the following plan we discussed and let me know if I can assist you in the future.   Shingrix is a vaccine for the prevention of Shingles in Adults 50 and older.  If you are on Medicare, the shingrix is covered under your Part D plan, so you will take both of the vaccines in the series at your pharmacy. Please check with your benefits regarding applicable copays or out of pocket expenses.  The Shingrix is given in 2 vaccines approx 8 weeks apart. You must receive the 2nd dose prior to 6 months from receipt of the first. Please have the pharmacist print out you Immunization  dates for our office records    Will check with your dtr regarding the need for a Tdap, A Tetanus is recommended every 10 years. Medicare covers a tetanus if you have a cut or wound; otherwise, there may be a charge. If you had not had a tetanus with pertusses, known as the Tdap, you can take this anytime.   Will bring a copy of your health care power of attorney  You had your last pneumonia vaccine today    These are the goals we discussed: Goals    . Patient Stated     To maintain and comply with the plan !       This is a list of the screening recommended for you and due dates:  Health Maintenance  Topic Date Due  . Pneumonia vaccines (2 of 2 - PPSV23) 08/30/2017  . Colon Cancer Screening  10/05/2017  . Flu Shot  05/26/2018  . Tetanus Vaccine  02/23/2022  .  Hepatitis C: One time screening is recommended by Center for Disease Control  (CDC) for  adults born from [birth year] through 1965.   Completed     Fat and Cholesterol Restricted Diet High levels of fat and cholesterol in your blood may lead to various health problems, such as diseases of the heart, blood vessels, gallbladder, liver, and pancreas. Fats are concentrated sources of energy that come in  various forms. Certain types of fat, including saturated fat, may be harmful in excess. Cholesterol is a substance needed by your body in small amounts. Your body makes all the cholesterol it needs. Excess cholesterol comes from the food you eat. When you have high levels of cholesterol and saturated fat in your blood, health problems can develop because the excess fat and cholesterol will gather along the walls of your blood vessels, causing them to narrow. Choosing the right foods will help you control your intake of fat and cholesterol. This will help keep the levels of these substances in your blood within normal limits and reduce your risk of disease. What is my plan? Your health care provider recommends that you:  Limit your fat intake to ______% or less of your total calories per day.  Limit the amount of cholesterol in your diet to less than _________mg per day.  Eat 20-30 grams of fiber each day.  What types of fat should I choose?  Choose healthy fats more often. Choose monounsaturated and polyunsaturated fats, such as olive and canola oil, flaxseeds, walnuts, almonds, and seeds.  Eat more omega-3 fats. Good choices include salmon, mackerel, sardines, tuna, flaxseed oil, and ground flaxseeds. Aim to eat fish at least two times a week.  Limit saturated fats. Saturated fats  are primarily found in animal products, such as meats, butter, and cream. Plant sources of saturated fats include palm oil, palm kernel oil, and coconut oil.  Avoid foods with partially hydrogenated oils in them. These contain trans fats. Examples of foods that contain trans fats are stick margarine, some tub margarines, cookies, crackers, and other baked goods. What general guidelines do I need to follow? These guidelines for healthy eating will help you control your intake of fat and cholesterol:  Check food labels carefully to identify foods with trans fats or high amounts of saturated fat.  Fill one half of  your plate with vegetables and green salads.  Fill one fourth of your plate with whole grains. Look for the word "whole" as the first word in the ingredient list.  Fill one fourth of your plate with lean protein foods.  Limit fruit to two servings a day. Choose fruit instead of juice.  Eat more foods that contain fiber, such as apples, broccoli, carrots, beans, peas, and barley.  Eat more home-cooked food and less restaurant, buffet, and fast food.  Limit or avoid alcohol.  Limit foods high in starch and sugar.  Limit fried foods.  Cook foods using methods other than frying. Baking, boiling, grilling, and broiling are all great options.  Lose weight if you are overweight. Losing just 5-10% of your initial body weight can help your overall health and prevent diseases such as diabetes and heart disease.  What foods can I eat? Grains  Whole grains, such as whole wheat or whole grain breads, crackers, cereals, and pasta. Unsweetened oatmeal, bulgur, barley, quinoa, or brown rice. Corn or whole wheat flour tortillas. Vegetables  Fresh or frozen vegetables (raw, steamed, roasted, or grilled). Green salads. Fruits  All fresh, canned (in natural juice), or frozen fruits. Meats and other protein foods  Ground beef (85% or leaner), grass-fed beef, or beef trimmed of fat. Skinless chicken or [place]. Ground chicken or [place]. Pork trimmed of fat. All fish and seafood. Eggs. Dried beans, peas, or lentils. Unsalted nuts or seeds. Unsalted canned or dry beans. Dairy  Low-fat dairy products, such as skim or 1% milk, 2% or reduced-fat cheeses, low-fat ricotta or cottage cheese, or plain low-fat yo Fats and oils  Tub margarines without trans fats. Light or reduced-fat mayonnaise and salad dressings. Avocado. Olive, canola, sesame, or safflower oils. Natural peanut or almond butter (choose ones without added sugar and oil). The items listed above may not be a complete list of recommended foods  or beverages. Contact your dietitian for more options. Foods to avoid Grains  White bread. White pasta. White rice. Cornbread. Bagels, pastries, and croissants. Crackers that contain trans fat. Vegetables  White potatoes. Corn. Creamed or fried vegetables. Vegetables in a cheese sauce. Fruits  Dried fruits. Canned fruit in light or heavy syrup. Fruit juice. Meats and other protein foods  Fatty cuts of meat. Ribs, chicken wings, bacon, sausage, bologna, salami, chitterlings, fatback, hot dogs, bratwurst, and packaged luncheon meats. Liver and organ meats. Dairy  Whole or 2% milk, cream, half-and-half, and cream cheese. Whole milk cheeses. Whole-fat or sweetened yogurt. Full-fat cheeses. Nondairy creamers and whipped toppings. Processed cheese, cheese spreads, or cheese curds. Beverages  Alcohol. Sweetened drinks (such as sodas, lemonade, and fruit drinks or punches). Fats and oils  Butter, stick margarine, lard, shortening, ghee, or bacon fat. Coconut, palm kernel, or palm oils. Sweets and desserts  Corn syrup, sugars, honey, and molasses. Candy. Jam and jelly. Syrup. Sweetened cereals. Cookies,  pies, cakes, donuts, muffins, and ice cream. The items listed above may not be a complete list of foods and beverages to avoid. Contact your dietitian for more information. This information is not intended to replace advice given to you by your health care provider. Make sure you discuss any questions you have with your health care provider. Document Released: 10/12/2005 Document Revised: 11/02/2014 Document Reviewed: 01/10/2014 Elsevier Interactive Patient Education  2018 [place] in the Home Falls can cause injuries. They can happen to people of all ages. There are many things you can do to make your home safe and to help prevent falls. What can I do on the outside of my home?  Regularly fix the edges of walkways and driveways and fix any cracks.  Remove anything  that might make you trip as you walk through a door, such as a raised step or threshold.  Trim any bushes or trees on the path to your home.  Use bright outdoor lighting.  Clear any walking paths of anything that might make someone trip, such as rocks or tools.  Regularly check to see if handrails are loose or broken. Make sure that both sides of any steps have handrails.  Any raised decks and porches should have guardrails on the edges.  Have any leaves, snow, or ice cleared regularly.  Use sand or salt on walking paths during winter.  Clean up any spills in your garage right away. This includes oil or grease spills. What can I do in the bathroom?  Use night lights.  Install grab bars by the toilet and in the tub and shower. Do not use towel bars as grab bars.  Use non-skid mats or decals in the tub or shower.  If you need to sit down in the shower, use a plastic, non-slip stool.  Keep the floor dry. Clean up any water that spills on the floor as soon as it happens.  Remove soap buildup in the tub or shower regularly.  Attach bath mats securely with double-sided non-slip rug tape.  Do not have throw rugs and other things on the floor that can make you trip. What can I do in the bedroom?  Use night lights.  Make sure that you have a light by your bed that is easy to reach.  Do not use any sheets or blankets that are too big for your bed. They should not hang down onto the floor.  Have a firm chair that has side arms. You can use this for support while you get dressed.  Do not have throw rugs and other things on the floor that can make you trip. What can I do in the kitchen?  Clean up any spills right away.  Avoid walking on wet floors.  Keep items that you use a lot in easy-to-reach places.  If you need to reach something above you, use a strong step stool that has a grab bar.  Keep electrical cords out of the way.  Do not use floor polish or wax that makes  floors slippery. If you must use wax, use non-skid floor wax.  Do not have throw rugs and other things on the floor that can make you trip. What can I do with my stairs?  Do not leave any items on the stairs.  Make sure that there are handrails on both sides of the stairs and use them. Fix handrails that are broken or loose. Make sure that handrails are as  long as the stairways.  Check any carpeting to make sure that it is firmly attached to the stairs. Fix any carpet that is loose or worn.  Avoid having throw rugs at the top or bottom of the stairs. If you do have throw rugs, attach them to the floor with carpet tape.  Make sure that you have a light switch at the top of the stairs and the bottom of the stairs. If you do not have them, ask someone to add them for you. What else can I do to help prevent falls?  Wear shoes that: ? Do not have high heels. ? Have rubber bottoms. ? Are comfortable and fit you well. ? Are closed at the toe. Do not wear sandals.  If you use a stepladder: ? Make sure that it is fully opened. Do not climb a closed stepladder. ? Make sure that both sides of the stepladder are locked into place. ? Ask someone to hold it for you, if possible.  Clearly mark and make sure that you can see: ? Any grab bars or handrails. ? First and last steps. ? Where the edge of each step is.  Use tools that help you move around (mobility aids) if they are needed. These include: ? Canes. ? Walkers. ? Scooters. ? Crutches.  Turn on the lights when you go into a dark area. Replace any light bulbs as soon as they burn out.  Set up your furniture so you have a clear path. Avoid moving your furniture around.  If any of your floors are uneven, fix them.  If there are any pets around you, be aware of where they are.  Review your medicines with your doctor. Some medicines can make you feel dizzy. This can increase your chance of falling. Ask your doctor what other things  that you can do to help prevent falls. This information is not intended to replace advice given to you by your health care provider. Make sure you discuss any questions you have with your health care provider. Document Released: 08/08/2009 Document Revised: 03/19/2016 Document Reviewed: 11/16/2014 Elsevier Interactive Patient Education  2018 [place] Maintenance, Male A healthy lifestyle and preventive care is important for your health and wellness. Ask your health care provider about what schedule of regular examinations is right for you. What should I know about weight and diet? Eat a Healthy Diet  Eat plenty of vegetables, fruits, whole grains, low-fat dairy products, and lean protein.  Do not eat a lot of foods high in solid fats, added sugars, or salt.  Maintain a Healthy Weight Regular exercise can help you achieve or maintain a healthy weight. You should:  Do at least 150 minutes of exercise each week. The exercise should increase your heart rate and make you sweat (moderate-intensity exercise).  Do strength-training exercises at least twice a week.  Watch Your Levels of Cholesterol and Blood Lipids  Have your blood tested for lipids and cholesterol every 5 years starting at 67 years of age. If you are at high risk for heart disease, you should start having your blood tested when you are 67 years old. You may need to have your cholesterol levels checked more often if: ? Your lipid or cholesterol levels are high. ? You are older than 67 years of age. ? You are at high risk for heart disease.  What should I know about cancer screening? Many types of cancers can be detected early and may often be  prevented. Lung Cancer  You should be screened every year for lung cancer if: ? You are a current smoker who has smoked for at least 30 years. ? You are a former smoker who has quit within the past 15 years.  Talk to your health care provider about your screening  options, when you should start screening, and how often you should be screened.  Colorectal Cancer  Routine colorectal cancer screening usually begins at 67 years of age and should be repeated every 5-10 years until you are 67 years old. You may need to be screened more often if early forms of precancerous polyps or small growths are found. Your health care provider may recommend screening at an earlier age if you have risk factors for colon cancer.  Your health care provider may recommend using home test kits to check for hidden blood in the stool.  A small camera at the end of a tube can be used to examine your colon (sigmoidoscopy or colonoscopy). This checks for the earliest forms of colorectal cancer.  Prostate and Testicular Cancer  Depending on your age and overall health, your health care provider may do certain tests to screen for prostate and testicular cancer.  Talk to your health care provider about any symptoms or concerns you have about testicular or prostate cancer.  Skin Cancer  Check your skin from head to toe regularly.  Tell your health care provider about any new moles or changes in moles, especially if: ? There is a change in a mole's size, shape, or color. ? You have a mole that is larger than a pencil eraser.  Always use sunscreen. Apply sunscreen liberally and repeat throughout the day.  Protect yourself by wearing long sleeves, pants, a wide-brimmed hat, and sunglasses when outside.  What should I know about heart disease, diabetes, and high blood pressure?  If you are 18-54 years of age, have your blood pressure checked every 3-5 years. If you are 72 years of age or older, have your blood pressure checked every year. You should have your blood pressure measured twice-once when you are at a hospital or clinic, and once when you are not at a hospital or clinic. Record the average of the two measurements. To check your blood pressure when you are not at a hospital  or clinic, you can use: ? An automated blood pressure machine at a pharmacy. ? A home blood pressure monitor.  Talk to your health care provider about your target blood pressure.  If you are between 22-18 years old, ask your health care provider if you should take aspirin to prevent heart disease.  Have regular diabetes screenings by checking your fasting blood sugar level. ? If you are at a normal weight and have a low risk for diabetes, have this test once every three years after the age of 45. ? If you are overweight and have a high risk for diabetes, consider being tested at a younger age or more often.  A one-time screening for abdominal aortic aneurysm (AAA) by ultrasound is recommended for men aged 12-75 years who are current or former smokers. What should I know about preventing infection? Hepatitis B If you have a higher risk for hepatitis B, you should be screened for this virus. Talk with your health care provider to find out if you are at risk for hepatitis B infection. Hepatitis C Blood testing is recommended for:  Everyone born from [birth year] through 1965.  Anyone with known risk factors  for hepatitis C.  Sexually Transmitted Diseases (STDs)  You should be screened each year for STDs including gonorrhea and chlamydia if: ? You are sexually active and are younger than 67 years of age. ? You are older than 67 years of age and your health care provider tells you that you are at risk for this type of infection. ? Your sexual activity has changed since you were last screened and you are at an increased risk for chlamydia or gonorrhea. Ask your health care provider if you are at risk.  Talk with your health care provider about whether you are at high risk of being infected with HIV. Your health care provider may recommend a prescription medicine to help prevent HIV infection.  What else can I do?  Schedule regular health, dental, and eye exams.  Stay current with your vaccines  (immunizations).  Do not use any tobacco products, such as cigarettes, chewing tobacco, and e-cigarettes. If you need help quitting, ask your health care provider.  Limit alcohol intake to no more than 2 drinks per day. One drink equals 12 ounces of beer, 5 ounces of wine, or 1 ounces of hard liquor.  Do not use street drugs.  Do not share needles.  Ask your health care provider for help if you need support or information about quitting drugs.  Tell your health care provider if you often feel depressed.  Tell your health care provider if you have ever been abused or do not feel safe at home. This information is not intended to replace advice given to you by your health care provider. Make sure you discuss any questions you have with your health care provider. Document Released: 04/09/2008 Document Revised: 06/10/2016 Document Reviewed: 07/16/2015 Elsevier Interactive Patient Education  [employer].

## 2018-05-25 NOTE — Telephone Encounter (Unsigned)
Copied from [place] [id number]. Topic: Quick Communication - See Telephone Encounter >> May 25, 2018  2:58 PM Percell Belt A wrote: CRM for notification. See Telephone encounter for: 05/25/18.  Pt called in and stated that he had an injection today during his cpe. The injection site is sore and would like to know if he can take motrin to help with the soreness with all the other meds he is one?     Best number [phone]

## 2018-05-25 NOTE — Patient Instructions (Addendum)
I will set up repeat colonoscopy  Consider new shingles vaccine (Shingrix) and check with insurance if interested.

## 2018-05-26 ENCOUNTER — Encounter: Payer: Self-pay | Admitting: Gastroenterology

## 2018-05-26 ENCOUNTER — Encounter: Payer: Self-pay | Admitting: Family Medicine

## 2018-05-26 NOTE — Addendum Note (Signed)
Addended by: Agnes Lawrence on: 05/26/2018 05:07 PM   Modules accepted: Orders

## 2018-05-30 ENCOUNTER — Other Ambulatory Visit: Payer: Self-pay | Admitting: *Deleted

## 2018-05-30 DIAGNOSIS — R972 Elevated prostate specific antigen [PSA]: Secondary | ICD-10-CM

## 2018-05-31 ENCOUNTER — Encounter: Payer: Self-pay | Admitting: Cardiovascular Disease

## 2018-05-31 MED ORDER — ROSUVASTATIN CALCIUM 40 MG PO TABS: 40.0000 mg | ORAL_TABLET | Freq: Every day | ORAL | 0 refills | Status: DC

## 2018-05-31 NOTE — Telephone Encounter (Signed)
Pt's medication was sent to pt's requested mail order pharmacy. Confirmation received.

## 2018-06-09 ENCOUNTER — Encounter: Payer: Self-pay | Admitting: Family Medicine

## 2018-06-09 ENCOUNTER — Ambulatory Visit (INDEPENDENT_AMBULATORY_CARE_PROVIDER_SITE_OTHER): Payer: PPO | Admitting: Family Medicine

## 2018-06-09 VITALS — BP 124/62 | HR 70 | Temp 98.3°F | Wt 210.9 lb

## 2018-06-09 DIAGNOSIS — M722 Plantar fascial fibromatosis: Secondary | ICD-10-CM | POA: Diagnosis not present

## 2018-06-09 NOTE — Patient Instructions (Signed)
Wear supportive shoes at all times (except sleeping) Stretches as much as you can.  Ice when resting.   Let me know if not better in 2 weeks.     Plantar Fasciitis Plantar fasciitis is a painful foot condition that affects the heel. It occurs when the band of tissue that connects the toes to the heel bone (plantar fascia) becomes irritated. This can happen after exercising too much or doing other repetitive activities (overuse injury). The pain from plantar fasciitis can range from mild irritation to severe pain that makes it difficult for you to walk or move. The pain is usually worse in the morning or after you have been sitting or lying down for a while. What are the causes? This condition may be caused by:  Standing for long periods of time.  Wearing shoes that do not fit.  Doing high-impact activities, including running, aerobics, and ballet.  Being overweight.  Having an abnormal way of walking (gait).  Having tight calf muscles.  Having high arches in your feet.  Starting a new athletic activity.  What are the signs or symptoms? The main symptom of this condition is heel pain. Other symptoms include:  Pain that gets worse after activity or exercise.  Pain that is worse in the morning or after resting.  Pain that goes away after you walk for a few minutes.  How is this diagnosed? This condition may be diagnosed based on your signs and symptoms. Your health care provider will also do a physical exam to check for:  A tender area on the bottom of your foot.  A high arch in your foot.  Pain when you move your foot.  Difficulty moving your foot.  You may also need to have imaging studies to confirm the diagnosis. These can include:  X-rays.  Ultrasound.  MRI.  How is this treated? Treatment for plantar fasciitis depends on the severity of the condition. Your treatment may include:  Rest, ice, and over-the-counter pain medicines to manage your  pain.  Exercises to stretch your calves and your plantar fascia.  A splint that holds your foot in a stretched, upward position while you sleep (night splint).  Physical therapy to relieve symptoms and prevent problems in the future.  Cortisone injections to relieve severe pain.  Extracorporeal shock wave therapy (ESWT) to stimulate damaged plantar fascia with electrical impulses. It is often used as a last resort before surgery.  Surgery, if other treatments have not worked after 12 months.  Follow these instructions at home:  Take medicines only as directed by your health care provider.  Avoid activities that cause pain.  Roll the bottom of your foot over a bag of ice or a bottle of cold water. Do this for 20 minutes, 3-4 times a day.  Perform simple stretches as directed by your health care provider.  Try wearing athletic shoes with air-sole or gel-sole cushions or soft shoe inserts.  Wear a night splint while sleeping, if directed by your health care provider.  Keep all follow-up appointments with your health care provider. How is this prevented?  Do not perform exercises or activities that cause heel pain.  Consider finding low-impact activities if you continue to have problems.  Lose weight if you need to. The best way to prevent plantar fasciitis is to avoid the activities that aggravate your plantar fascia. Contact a health care provider if:  Your symptoms do not go away after treatment with home care measures.  Your pain gets  worse.  Your pain affects your ability to move or do your daily activities. This information is not intended to replace advice given to you by your health care provider. Make sure you discuss any questions you have with your health care provider. Document Released: 07/07/2001 Document Revised: 03/16/2016 Document Reviewed: 08/22/2014 Elsevier Interactive Patient Education  2018 [employer]. Plantar Fasciitis Rehab Ask your health care  provider which exercises are safe for you. Do exercises exactly as told by your health care provider and adjust them as directed. It is normal to feel mild stretching, pulling, tightness, or discomfort as you do these exercises, but you should stop right away if you feel sudden pain or your pain gets worse. Do not begin these exercises until told by your health care provider. Stretching and range of motion exercises These exercises warm up your muscles and joints and improve the movement and flexibility of your foot. These exercises also help to relieve pain. Exercise A: Plantar fascia stretch  1. Sit with your left / right leg crossed over your opposite knee. 2. Hold your heel with one hand with that thumb near your arch. With your other hand, hold your toes and gently pull them back toward the top of your foot. You should feel a stretch on the bottom of your toes or your foot or both. 3. Hold this stretch for__________ seconds. 4. Slowly release your toes and return to the starting position. Repeat __________ times. Complete this exercise __________ times a day. Exercise B: Gastroc, standing  1. Stand with your hands against a wall. 2. Extend your left / right leg behind you, and bend your front knee slightly. 3. Keeping your heels on the floor and keeping your back knee straight, shift your weight toward the wall without arching your back. You should feel a gentle stretch in your left / right calf. 4. Hold this position for __________ seconds. Repeat __________ times. Complete this exercise __________ times a day. Exercise C: Soleus, standing 1. Stand with your hands against a wall. 2. Extend your left / right leg behind you, and bend your front knee slightly. 3. Keeping your heels on the floor, bend your back knee and slightly shift your weight over the back leg. You should feel a gentle stretch deep in your calf. 4. Hold this position for __________ seconds. Repeat __________ times.  Complete this exercise __________ times a day. Exercise D: Gastrocsoleus, standing 1. Stand with the ball of your left / right foot on a step. The ball of your foot is on the walking surface, right under your toes. 2. Keep your other foot firmly on the same step. 3. Hold onto the wall or a railing for balance. 4. Slowly lift your other foot, allowing your body weight to press your heel down over the edge of the step. You should feel a stretch in your left / right calf. 5. Hold this position for __________ seconds. 6. Return both feet to the step. 7. Repeat this exercise with a slight bend in your left / right knee. Repeat __________ times with your left / right knee straight and __________ times with your left / right knee bent. Complete this exercise __________ times a day. Balance exercise This exercise builds your balance and strength control of your arch to help take pressure off your plantar fascia. Exercise E: Single leg stand 1. Without shoes, stand near a railing or in a doorway. You may hold onto the railing or door frame as needed. 2.  Stand on your left / right foot. Keep your big toe down on the floor and try to keep your arch lifted. Do not let your foot roll inward. 3. Hold this position for __________ seconds. 4. If this exercise is too easy, you can try it with your eyes closed or while standing on a pillow. Repeat __________ times. Complete this exercise __________ times a day. This information is not intended to replace advice given to you by your health care provider. Make sure you discuss any questions you have with your health care provider. Document Released: 10/12/2005 Document Revised: 06/16/2016 Document Reviewed: 08/26/2015 Elsevier Interactive Patient Education  2018 [employer].

## 2018-06-09 NOTE — Progress Notes (Signed)
Jordan [name] DOB: [birth year] Encounter date: 06/09/2018  This is a 67 y.o. male who presents with Chief Complaint  Patient presents with  . Foot Pain    started monday, arch of the foot, entire arch, painful to walk, pt states it is not as painful when he wears shoes with more support. pt describes pain as the same feeling as a stone bruise, has not taken any OTC meds, no ice/heat    History of present illness:  Noted around Monday he was having pain in arch of foot. Feels like "stone bruise". Can still walk. Hasn't noted any changes in the skin, hasn't noted swelling. Can put pressure on heel and ball of foot without hurting foot.   Better with good supportive shoe.   No changes in activity or shoes over weekend. Was wearing flip flops over weekend, but not really more than typical.     No Known Allergies Current Meds  Medication Sig  . aspirin EC 81 MG tablet Take 81 mg by mouth. 2 tablets daily  . folic acid (FOLVITE) 601 MCG tablet Take 400 mcg by mouth daily.  [name] ketoconazole (NIZORAL) 2 % shampoo Apply 1 application topically 2 (two) times a week.   . Misc Natural Products (TART CHERRY ADVANCED PO) Take 1 capsule by mouth 2 (two) times daily. 1000mg   . Multiple Vitamin (MULTIVITAMIN) capsule Take 1 capsule by mouth daily.  . nitroGLYCERIN (NITROSTAT) 0.4 MG SL tablet Place 1 tablet (0.4 mg total) under the tongue every 5 (five) minutes as needed for chest pain.  . Omega-3 1400 MG CAPS Take 1 capsule by mouth 2 (two) times daily.  . rosuvastatin (CRESTOR) 40 MG tablet Take 1 tablet (40 mg total) by mouth at bedtime. Please keep upcoming appt in September for future refill. Thank you  . Turmeric 450 MG CAPS Take 1 capsule by mouth 2 (two) times daily.    Review of Systems  Musculoskeletal: Positive for gait problem (hurts with walking). Negative for arthralgias and joint swelling.       See hpi    Objective:  BP 124/62 (BP Location: Left Arm, Patient Position:  Sitting, Cuff Size: Normal)   Pulse 70   Temp 98.3 F (36.8 C) (Oral)   Wt 210 lb 14.4 oz (95.7 kg)   SpO2 98%   BMI 26.36 kg/m   Weight: 210 lb 14.4 oz (95.7 kg)   BP Readings from Last 3 Encounters:  06/09/18 124/62  05/25/18 102/64  05/25/18 102/64   Wt Readings from Last 3 Encounters:  06/09/18 210 lb 14.4 oz (95.7 kg)  05/25/18 208 lb (94.3 kg)  05/25/18 208 lb 4.8 oz (94.5 kg)    Physical Exam  Constitutional: He appears well-developed and well-nourished. No distress.  Pulmonary/Chest: Effort normal.  Musculoskeletal:  There is tenderness along plantar fascia right foot. It does feel more tense than left side. No nodules or other palpable abnormalities of fascia appreciated. There is no bony tenderness of foot. Palpation of this fascia reproduces patient's pain.  Skin: Skin is warm and dry. No rash noted. No erythema.  Psychiatric: He has a normal mood and affect. His behavior is normal.    Assessment/Plan  1. Plantar fasciitis of right foot Stretches and exercises given today. Ice foot as well. Prefers not to take medications if possible. Wear supportive shoes at all times. If not improved in 2 weeks time let us know (or if worsening in meanwhile).  Return if symptoms worsen or fail to improve.  Micheline Rough, MD

## 2018-06-11 ENCOUNTER — Other Ambulatory Visit: Payer: Self-pay | Admitting: Cardiovascular Disease

## 2018-06-22 ENCOUNTER — Encounter: Payer: Self-pay | Admitting: Cardiovascular Disease

## 2018-07-13 ENCOUNTER — Ambulatory Visit: Payer: PPO | Admitting: Cardiovascular Disease

## 2018-07-13 ENCOUNTER — Encounter: Payer: Self-pay | Admitting: Cardiovascular Disease

## 2018-07-13 VITALS — BP 112/64 | HR 64 | Ht 75.0 in | Wt 207.0 lb

## 2018-07-13 DIAGNOSIS — E782 Mixed hyperlipidemia: Secondary | ICD-10-CM

## 2018-07-13 DIAGNOSIS — I2510 Atherosclerotic heart disease of native coronary artery without angina pectoris: Secondary | ICD-10-CM

## 2018-07-13 NOTE — Progress Notes (Signed)
Cardiology Office Note:    Date:  07/13/2018   ID:  Jordan [name], DOB 1951/09/03, MRN [id number]  PCP:  Jordan Post, MD  Cardiologist:  Jordan Mocha, MD  Electrophysiologist:  None   Referring MD: Jordan Post, MD   Chief Complaint  Patient presents with  . Follow-up    CAD    History of Present Illness:    Jordan [name] is a 67 y.o. male with a hx of coronary artery disease, presenting for follow-up evaluation.  The patient has coronary artery disease with history of PCI of the LAD in 2004. At that time he was noted to have exertional angina and was found to have subtotal occlusion of the proximal LAD treated with a Cypher drug-eluting stent. The patient presented for an exercise treadmill in 2017 and demonstrated excellent exercise capacity was no angina and normal blood pressure response. However, he had a markedly positive EKG portion of the stress test. He was referred for an exercise stress Myoview scan which again showed an abnormal EKG response, but normal perfusion and no symptoms.  He is been managed medically since that time.  The patient is here alone today.  He feels very well and exercises regularly with no exertional symptoms.  Today, he denies symptoms of palpitations, chest pain, shortness of breath, orthopnea, PND, lower extremity edema, dizziness, or syncope.   Past Medical History:  Diagnosis Date  . Colon polyps   . Heart disease   . Hyperlipidemia   . S/P coronary artery stent placement     Past Surgical History:  Procedure Laterality Date  . heart stent  2004  . HERNIA REPAIR  2004   right  . SPINE SURGERY  2001   microendoscopic discectomy    Current Medications: Current Meds  Medication Sig  . aspirin EC 81 MG tablet Take 81 mg by mouth. 2 tablets daily  . folic acid (FOLVITE) 841 MCG tablet Take 400 mcg by mouth daily.  [name] ketoconazole (NIZORAL) 2 % shampoo Apply 1 application topically 2 (two) times a week.   . Misc  Natural Products (TART CHERRY ADVANCED PO) Take 1 capsule by mouth 2 (two) times daily. 1000mg   . Multiple Vitamin (MULTIVITAMIN) capsule Take 1 capsule by mouth daily.  . nitroGLYCERIN (NITROSTAT) 0.4 MG SL tablet Place 1 tablet (0.4 mg total) under the tongue every 5 (five) minutes as needed for chest pain.  . Omega-3 Fatty Acids (OMEGA-3 PO) Take 720 mg by mouth daily.   . rosuvastatin (CRESTOR) 40 MG tablet Take 1 tablet (40 mg total) by mouth at bedtime.  . Turmeric 400 MG CAPS Take 1 capsule by mouth daily.      Allergies:   Patient has no known allergies.   Social History   Socioeconomic History  . Marital status: Married    Spouse name: Not on file  . Number of children: Not on file  . Years of education: Not on file  . Highest education level: Not on file  Occupational History  . Not on file  Social Needs  . Financial resource strain: Not on file  . Food insecurity:    Worry: Not on file    Inability: Not on file  . Transportation needs:    Medical: Not on file    Non-medical: Not on file  Tobacco Use  . Smoking status: Never Smoker  . Smokeless tobacco: Never Used  Substance and Sexual Activity  . Alcohol use: Yes    Alcohol/week:  3.0 standard drinks    Types: 3 Cans of beer per week  . Drug use: No  . Sexual activity: Not on file  Lifestyle  . Physical activity:    Days per week: Not on file    Minutes per session: Not on file  . Stress: Not on file  Relationships  . Social connections:    Talks on phone: Not on file    Gets together: Not on file    Attends religious service: Not on file    Active member of club or organization: Not on file    Attends meetings of clubs or organizations: Not on file    Relationship status: Not on file  Other Topics Concern  . Not on file  Social History Narrative  . Not on file     Family History: The patient's family history includes Heart attack in his unknown relative; Heart attack (age of onset: 62) in his  brother; Heart attack (age of onset: 18) in his father; Mitral valve prolapse in his mother; Stroke (age of onset: 46) in his unknown relative. There is no history of Colon cancer or Stomach cancer.  ROS:   Please see the history of present illness.    All other systems reviewed and are negative.  EKGs/Labs/Other Studies Reviewed:    EKG:  EKG is ordered today.  The ekg ordered today demonstrates normal sinus rhythm 64 bpm, occasional PVC, otherwise within normal limits.  Recent Labs: 05/25/2018: ALT 27; BUN 13; Creatinine, Ser 0.90; Hemoglobin 16.3; Platelets 149.0; Potassium 4.0; Sodium 142; TSH 1.96  Recent Lipid Panel    Component Value Date/Time   CHOL 127 05/25/2018 0900   TRIG 80.0 05/25/2018 0900   HDL 44.80 05/25/2018 0900   CHOLHDL 3 05/25/2018 0900   VLDL 16.0 05/25/2018 0900   LDLCALC 66 05/25/2018 0900    Physical Exam:    VS:  BP 112/64   Pulse 64   Ht 6\' 3"  (1.905 m)   Wt 207 lb (93.9 kg)   SpO2 97%   BMI 25.87 kg/m     Wt Readings from Last 3 Encounters:  07/13/18 207 lb (93.9 kg)  06/09/18 210 lb 14.4 oz (95.7 kg)  05/25/18 208 lb (94.3 kg)     GEN:  Well nourished, well developed in no acute distress HEENT: Normal NECK: No JVD; No carotid bruits LYMPHATICS: No lymphadenopathy CARDIAC: RRR, no murmurs, rubs, gallops RESPIRATORY:  Clear to auscultation without rales, wheezing or rhonchi  ABDOMEN: Soft, non-tender, non-distended MUSCULOSKELETAL:  No edema; No deformity  SKIN: Warm and dry NEUROLOGIC:  Alert and oriented x 3 PSYCHIATRIC:  Normal affect   ASSESSMENT:    1. Coronary artery disease involving native coronary artery of native heart without angina pectoris   2. Mixed hyperlipidemia    PLAN:    In order of problems listed above:  1. The patient continues to do well with no symptoms and a good functional capacity.  He continues on aspirin for antiplatelet therapy as well as a high intensity statin drug. 2. Recent lipids are reviewed  and demonstrate a cholesterol of 127, HDL 45, LDL 66, triglycerides 80.  He will continue on Crestor 40 mg daily.  We discussed the fact that he does not appear to have an indication for omega-3 fatty acids and will discontinue this.   Medication Adjustments/Labs and Tests Ordered: Current medicines are reviewed at length with the patient today.  Concerns regarding medicines are outlined above.  Orders Placed This  Encounter  Procedures  . EKG 12-Lead   No orders of the defined types were placed in this encounter.   Patient Instructions  Medication Instructions:  Your provider recommends that you continue on your current medications as directed. Please refer to the Current Medication list given to you today.    Labwork: None  Testing/Procedures: None  Follow-Up: Your provider wants you to follow-up in: 1 year with Dr. Burt Knack. You will receive a reminder letter in the mail two months in advance. If you don't receive a letter, please call our office to schedule the follow-up appointment.    Any Other Special Instructions Will Be Listed Below (If Applicable).     If you need a refill on your cardiac medications before your next appointment, please call your pharmacy.      Signed, Jordan Mocha, MD  07/13/2018 5:06 PM    [place]

## 2018-07-13 NOTE — Patient Instructions (Signed)

## 2018-07-15 ENCOUNTER — Other Ambulatory Visit: Payer: Self-pay | Admitting: Cardiovascular Disease

## 2018-07-15 MED ORDER — NITROGLYCERIN 0.4 MG SL SUBL: 0.4000 mg | SUBLINGUAL_TABLET | SUBLINGUAL | 6 refills | Status: DC | PRN

## 2018-07-18 ENCOUNTER — Encounter: Payer: PPO | Admitting: Gastroenterology

## 2018-08-08 ENCOUNTER — Encounter: Payer: Self-pay | Admitting: Gastroenterology

## 2018-08-08 ENCOUNTER — Ambulatory Visit (AMBULATORY_SURGERY_CENTER): Payer: Self-pay | Admitting: *Deleted

## 2018-08-08 ENCOUNTER — Other Ambulatory Visit: Payer: Self-pay

## 2018-08-08 VITALS — Ht 76.0 in | Wt 206.0 lb

## 2018-08-08 DIAGNOSIS — Z86010 Personal history of colonic polyps: Secondary | ICD-10-CM

## 2018-08-08 MED ORDER — PLENVU 140 G PO SOLR: 1.0000 | Freq: Once | ORAL | 0 refills | Status: AC

## 2018-08-08 NOTE — Progress Notes (Signed)
No egg or soy allergy known to patient  No issues with past sedation with any surgeries  or procedures, no intubation problems  No diet pills per patient No home 02 use per patient  No blood thinners per patient  Pt denies issues with constipation  No A fib or A flutter  EMMI video offered and declined by the patient. At end of the visit patient stating he does not have a caregiver and would like to reschedule for January.Schedule does not extend to January. Patient given business card and instructed to call at the end of November.

## 2018-08-18 ENCOUNTER — Encounter: Payer: Self-pay | Admitting: Family Medicine

## 2018-08-19 ENCOUNTER — Encounter: Payer: PPO | Admitting: Gastroenterology

## 2018-08-24 ENCOUNTER — Encounter: Payer: Self-pay | Admitting: Family Medicine

## 2018-08-30 ENCOUNTER — Encounter: Payer: Self-pay | Admitting: Family Medicine

## 2018-10-28 ENCOUNTER — Other Ambulatory Visit: Payer: Self-pay

## 2018-10-28 ENCOUNTER — Ambulatory Visit (INDEPENDENT_AMBULATORY_CARE_PROVIDER_SITE_OTHER): Payer: PPO | Admitting: Family Medicine

## 2018-10-28 ENCOUNTER — Ambulatory Visit: Payer: PPO | Admitting: Family Medicine

## 2018-10-28 ENCOUNTER — Telehealth: Payer: Self-pay | Admitting: *Deleted

## 2018-10-28 ENCOUNTER — Encounter: Payer: Self-pay | Admitting: Family Medicine

## 2018-10-28 VITALS — BP 118/62 | HR 75 | Temp 97.9°F | Resp 16 | Ht 75.0 in | Wt 209.6 lb

## 2018-10-28 DIAGNOSIS — J3089 Other allergic rhinitis: Secondary | ICD-10-CM | POA: Diagnosis not present

## 2018-10-28 DIAGNOSIS — J069 Acute upper respiratory infection, unspecified: Secondary | ICD-10-CM

## 2018-10-28 DIAGNOSIS — B9789 Other viral agents as the cause of diseases classified elsewhere: Secondary | ICD-10-CM

## 2018-10-28 NOTE — Progress Notes (Signed)
Subjective   CC:  Chief Complaint  Patient presents with  . Nasal Congestion    started early December, tried Claritin.[name] Also has a non-productive cough      HPI: Jordan [name] is a 68 y.o. male who presents to the office today to address the problems listed above in the chief complaint.  Patient complains of typical URI symptoms including nasal congestion, mild sore throat, cough, and mild malaise.  The symptoms have been present for about a week. Has underlying chronic allergies.. He denies high fever or productive cough, sinus pain, shortness of breath or significant GI symptoms.  Over-the-counter cold medicines have been minimally or mildly helpful.  Assessment    1. Viral URI with cough   2. Perennial allergic rhinitis      Plan   URI, viral: discussed dx; no sign or sx of bacterial infection is present. Treat supportively with antihistamines, decongestants, and/or cough meds. See AVS for care instructions.   Follow up: Return if symptoms worsen or fail to improve.   No orders of the defined types were placed in this encounter.  No orders of the defined types were placed in this encounter.     I reviewed the patients updated PMH, FH, and SocHx.    Patient Active Problem List   Diagnosis Date Noted  . Bursitis of right shoulder 09/26/2014  . Bursitis of left shoulder 07/30/2014  . Lt arm pain with moderate risk of acute coronary syndrome 04/09/2014  . Coronary atherosclerosis 08/27/2009  . HYPERCHOLESTEROLEMIA 07/04/2009   Current Meds  Medication Sig  . aspirin EC 81 MG tablet Take 160 mg by mouth. 2 tablets daily   . folic acid (FOLVITE) 510 MCG tablet Take 400 mcg by mouth daily.  [name] ketoconazole (NIZORAL) 2 % shampoo Apply 1 application topically 2 (two) times a week.   . Multiple Vitamin (MULTIVITAMIN) capsule Take 1 capsule by mouth daily.  . nitroGLYCERIN (NITROSTAT) 0.4 MG SL tablet Place 1 tablet (0.4 mg total) under the tongue every 5 (five) minutes  as needed for chest pain.  . rosuvastatin (CRESTOR) 40 MG tablet Take 1 tablet (40 mg total) by mouth at bedtime.  . [DISCONTINUED] Misc Natural Products (TART CHERRY ADVANCED PO) Take 1 capsule by mouth 2 (two) times daily. 1000mg   . [DISCONTINUED] Omega-3 Fatty Acids (OMEGA-3 PO) Take 720 mg by mouth daily.     Review of Systems: Constitutional: Negative for fever malaise or anorexia Cardiovascular: negative for chest pain Respiratory: negative for SOB or pleuritic chest pain Gastrointestinal: negative for abdominal pain  Objective  Vitals: BP 118/62   Pulse 75   Temp 97.9 F (36.6 C) (Oral)   Resp 16   Ht 6\' 3"  (1.905 m)   Wt 209 lb 9.6 oz (95.1 kg)   SpO2 98%   BMI 26.20 kg/m  General: no acute respiratory distress  Psych:  Alert and oriented, normal mood and affect HEENT: Normocephalic, nasal congestion present, TMs w/o erythema, OP with erythema w/o exudate, + cervical LAD, supple neck  Cardiovascular:  RRR without murmur or gallop. no peripheral edema Respiratory:  Good breath sounds bilaterally, CTAB with normal respiratory effort Skin:  Warm, no rashes Neurologic:   Mental status is normal. normal gait  Commons side effects, risks, benefits, and alternatives for medications and treatment plan prescribed today were discussed, and the patient expressed understanding of the given instructions. Patient is instructed to call or message via MyChart if he/she has any questions or concerns regarding  our treatment plan. No barriers to understanding were identified. We discussed Red Flag symptoms and signs in detail. Patient expressed understanding regarding what to do in case of urgent or emergency type symptoms.   Medication list was reconciled, printed and provided to the patient in AVS. Patient instructions and summary information was reviewed with the patient as documented in the AVS. This note was prepared with assistance of Dragon voice recognition software. Occasional  wrong-word or sound-a-like substitutions may have occurred due to the inherent limitations of voice recognition software

## 2018-10-28 NOTE — Telephone Encounter (Signed)
Called patient and let him know that I have checked the schedule again and do not see an opening. Patient stated that the call center had him on the waiting list and patient stated if he did not hear back from [place] then he would go to an urgent clinic.

## 2018-10-28 NOTE — Telephone Encounter (Signed)
Copied from [place] [phone]. Topic: Appointment Scheduling - Scheduling Inquiry for Clinic >> Oct 28, 2018  8:26 AM Burchel, Abbi R wrote: Pt requesting appt today if possible for congestion/cough.    Pt cell: [phone]

## 2018-10-28 NOTE — Patient Instructions (Addendum)
Please follow up if symptoms do not improve or as needed.   You may use Delsym cough syrup or Mucinex DM to help with congestion and coughing. You may add Claritin  What is an Upper Respiratory Infection? An Upper Respiratory Infection (URI), sometimes called a "cold," occurs when viruses attack the nose, throat and chest. A URI usually runs its course in 2-14 days, although most people feel better in about a week.  Do I need antibiotics? What if my mucus is green? When germs that cause colds first infect the nose and sinuses, the nose makes clear mucus. This helps wash the germs from the nose and sinuses. After two or three days, the body's immune cells fight back, changing the mucus to a white or yellow color. As the bacteria that live in the nose grow back, they may also be found in the mucus, which changes the mucus to a greenish color. This is normal and does not mean you or your child needs antibiotics.  Antibiotics are needed only if your healthcare provider tells you that you have a bacterial infection. Your healthcare provider may prescribe other medicine or give tips to help with a cold's symptoms, but antibiotics are not needed to treat a cold or runny nose.  How do I prevent spreading a URI to others? To prevent spreading to others, try the following: ? Stay at home while you are sick ? Avoid close contact with others, such as hugging, kissing, or shaking hands ? Move away from people before coughing or sneezing ? Cough and sneeze into a tissue then throw it away, or cough and sneeze into your upper shirt sleeve, completely covering your mouth and nose ? Wash your hands after coughing, sneezing, or blowing your nose ? Disinfect frequently touched surfaces, and objects such as toys and doorknobs  How is a URI treated? There is no cure for the common cold. For relief, try ? Getting plenty of rest  ? Drinking fluids ? Gargling with warm salt water and using cough drops or throat  sprays ? Taking over-the-counter pain or cold medicines. However: o Do not give aspirin to children. And do not give cough medicine to children under four.  o Over-the-counter medicines may help ease symptoms but will not make your cold go away faster. o ALWAYS read the label and use medications as directed.  What if I don't feel better? Follow up with your primary care provider if: ? Unusually severe cold symptoms or symptoms that last more than 10-14 days ? High fever (greater than 100.62F) ? Ear pain or sinus type headache ? Cough that gets worse while other cold symptoms improve ? Flare-up of any chronic lung problem, such as asthma ? If you develop any of the following you should go to the nearest urgent care center or emergency room: o shortness of breath, pain or pressure in the chest, high fever that doesn't get better with fever reducing medicine, confusion, fainting, severe vomiting, and/or severe facial pain.  For more information, visit: ? [place] [employer]: https://hill.biz/ ? Centers for Disease Control: CompanyReservations.it  Sources: TrashEliminator.se and http://www.davis-sullivan.com/.html

## 2018-10-28 NOTE — Telephone Encounter (Signed)
Patient had an appointment with Dr. Jonni Sanger today.

## 2018-11-10 ENCOUNTER — Encounter: Payer: Self-pay | Admitting: Family Medicine

## 2018-11-10 ENCOUNTER — Ambulatory Visit (INDEPENDENT_AMBULATORY_CARE_PROVIDER_SITE_OTHER): Payer: PPO | Admitting: Family Medicine

## 2018-11-10 VITALS — BP 130/62 | HR 66 | Temp 98.0°F | Wt 207.5 lb

## 2018-11-10 DIAGNOSIS — J019 Acute sinusitis, unspecified: Secondary | ICD-10-CM | POA: Diagnosis not present

## 2018-11-10 MED ORDER — AZITHROMYCIN 250 MG PO TABS: ORAL_TABLET | ORAL | 0 refills | Status: DC

## 2018-11-10 MED ORDER — METHYLPREDNISOLONE 4 MG PO TBPK: ORAL_TABLET | ORAL | 0 refills | Status: DC

## 2018-11-10 NOTE — Progress Notes (Signed)
   Subjective:    Patient ID: Jordan [name], male    DOB: 11-16-1950, 68 y.o.   MRN: [id number]  HPI Here for 6 weeks of sinus pressure, ear pressure, PND, and coughing up green sputum. No fever. Using Mucinex DM and Claritin.    Review of Systems  Constitutional: Negative.   HENT: Positive for congestion, postnasal drip, sinus pressure and sinus pain. Negative for sore throat.   Eyes: Negative.   Respiratory: Positive for cough.        Objective:   Physical Exam Constitutional:      Appearance: Normal appearance.  HENT:     Right Ear: Tympanic membrane and ear canal normal.     Left Ear: Tympanic membrane and ear canal normal.     Nose: Nose normal.     Mouth/Throat:     Pharynx: Oropharynx is clear.  Eyes:     Conjunctiva/sclera: Conjunctivae normal.  Pulmonary:     Effort: Pulmonary effort is normal. No respiratory distress.     Breath sounds: Normal breath sounds. No stridor. No wheezing, rhonchi or rales.  Neurological:     Mental Status: He is alert.           Assessment & Plan:  Sinusitis, treat with a Zpack and a Medrol dose pack. Alysia Penna, MD

## 2018-11-29 ENCOUNTER — Other Ambulatory Visit: Payer: PPO

## 2018-11-29 ENCOUNTER — Encounter: Payer: Self-pay | Admitting: Gastroenterology

## 2018-12-19 ENCOUNTER — Encounter: Payer: Self-pay | Admitting: Family Medicine

## 2018-12-21 ENCOUNTER — Other Ambulatory Visit: Payer: Self-pay

## 2018-12-21 DIAGNOSIS — D126 Benign neoplasm of colon, unspecified: Secondary | ICD-10-CM

## 2018-12-26 ENCOUNTER — Other Ambulatory Visit: Payer: Self-pay | Admitting: Cardiovascular Disease

## 2019-01-06 ENCOUNTER — Encounter: Payer: PPO | Admitting: Gastroenterology

## 2019-04-21 ENCOUNTER — Ambulatory Visit: Payer: Self-pay | Admitting: Family Medicine

## 2019-04-21 ENCOUNTER — Other Ambulatory Visit: Payer: Self-pay

## 2019-04-21 ENCOUNTER — Encounter: Payer: Self-pay | Admitting: Family Medicine

## 2019-04-21 ENCOUNTER — Ambulatory Visit (INDEPENDENT_AMBULATORY_CARE_PROVIDER_SITE_OTHER): Payer: PPO | Admitting: Family Medicine

## 2019-04-21 ENCOUNTER — Telehealth: Payer: Self-pay | Admitting: *Deleted

## 2019-04-21 DIAGNOSIS — Z20822 Contact with and (suspected) exposure to covid-19: Secondary | ICD-10-CM

## 2019-04-21 DIAGNOSIS — Z20828 Contact with and (suspected) exposure to other viral communicable diseases: Secondary | ICD-10-CM | POA: Diagnosis not present

## 2019-04-21 NOTE — Telephone Encounter (Signed)
Pt is back in town and want to get covid testing in [place] Monday. Pt had vitual appt with Grier Mitts and she suggest he be tested. Pt advise pt

## 2019-04-21 NOTE — Telephone Encounter (Signed)
Pt scheduled for virtual visit 

## 2019-04-21 NOTE — Telephone Encounter (Signed)
Patient returning call after virtual visit today with Dr. Volanda Napoleon to discuss getting COVID-19 testing at his current location in [place]. Pt states that he was able to contact [facility] in [place] which requires a referral from MD in order to place order and to get an appt. Pt would like to know if Dr. Volanda Napoleon could contact this facility to place referral for testing today if possible. Pt was not given a direct contact person to call back for referral to be placed. Pt would also like to know if there is a facility that is closer to [facility] once the provider is able to speak with someone at the health care system. Pt can be contacted at [place] Phone: [phone]

## 2019-04-21 NOTE — Telephone Encounter (Signed)
Pt. Is out of town in [place]. on vacation and reports he has had exposure to COVID 19 playing golf. He has no symptoms. Is concerned because of his age and heart history. Warm transfer to [facility] in the practice for virtual visit.  Answer Assessment - Initial Assessment Questions 1. CLOSE CONTACT: "Who is the person with the confirmed or suspected COVID-19 infection that you were exposed to?"     Person at golf club in [facility] [facility]. 2. PLACE of CONTACT: "Where were you when you were exposed to COVID-19?" (e.g., home, school, medical waiting room; which city?)     Golf club 3. TYPE of CONTACT: "How much contact was there?" (e.g., sitting next to, live in same house, work in same office, same building)     Social distance 4. DURATION of CONTACT: "How long were you in contact with the COVID-19 patient?" (e.g., a few seconds, passed by person, a few minutes, live with the patient)     Few minutes 5. DATE of CONTACT: "When did you have contact with a COVID-19 patient?" (e.g., how many days ago)     This past Tuesday 04/18/19 6. TRAVEL: "Have you traveled out of the country recently?" If so, "When and where?"     * Also ask about out-of-state travel, since the CDC has identified some high-risk cities for community spread in the [place].     * Note: Travel becomes less relevant if there is widespread community transmission where the patient lives.     No 7. COMMUNITY SPREAD: "Are there lots of cases of COVID-19 (community spread) where you live?" (See public health department website, if unsure)       No 8. SYMPTOMS: "Do you have any symptoms?" (e.g., fever, cough, breathing difficulty)     No 9. PREGNANCY OR POSTPARTUM: "Is there any chance you are pregnant?" "When was your last menstrual period?" "Did you deliver in the last 2 weeks?"     n/a 10. HIGH RISK: "Do you have any heart or lung problems? Do you have a weak immune system?" (e.g., CHF, COPD, asthma, HIV positive, chemotherapy, renal failure,  diabetes mellitus, sickle cell anemia)       Heart history  Protocols used: CORONAVIRUS (COVID-19) EXPOSURE-A-AH

## 2019-04-21 NOTE — Progress Notes (Signed)
Virtual Visit via Video Note  I connected with Jordan [name] on 04/21/19 at 11:00 AM EDT by a video enabled telemedicine application and verified that I am speaking with the correct person using two identifiers.  Location patient: home Location provider:work or home office Persons participating in the virtual visit: patient, provider  I discussed the limitations of evaluation and management by telemedicine and the availability of in person appointments. The patient expressed understanding and agreed to proceed.   HPI: Pt is a 68 yo male with pmh sig for CAD s/p stent and HLD, typically seen by Dr. Elease Hashimoto.  Pt is currently in [place], [place] with 12 family members.  Pt played golf on Tuesday, later one of his golf buddies got an email from the golf club stating an employee tested positive for COVID-19.   Pt states he does not recall close contact with any employees.  At most he was an arm's length away from one employee and touched a bag given to him by another employee.  Pt and his son (who also went golfing) are asymptomatic.  Pt inquires about if he should be tested.  States his son was trying to contact the HD, but they feel he does not need testing.   ROS: See pertinent positives and negatives per HPI.  Past Medical History:  Diagnosis Date  . Colon polyps   . Heart disease   . Hyperlipidemia   . S/P coronary artery stent placement     Past Surgical History:  Procedure Laterality Date  . COLONOSCOPY    . heart stent  2004  . HERNIA REPAIR  2004   right  . POLYPECTOMY    . SPINE SURGERY  2001   microendoscopic discectomy    Family History  Problem Relation Age of Onset  . Heart attack Father 33       deceased  . Mitral valve prolapse Mother   . Heart attack Brother 18  . Heart attack Other        mothers dad died at 49 of his third heart attack/two uncles who died age 16-58  . Stroke Other 65       uncle   . Colon cancer Neg Hx   . Stomach cancer Neg Hx   . Colon  polyps Neg Hx   . Esophageal cancer Neg Hx   . Rectal cancer Neg Hx      Current Outpatient Medications:  .  aspirin EC 81 MG tablet, Take 160 mg by mouth. 2 tablets daily , Disp: , Rfl:  .  azithromycin (ZITHROMAX Z-PAK) 250 MG tablet, As directed, Disp: 6 each, Rfl: 0 .  folic acid (FOLVITE) 740 MCG tablet, Take 400 mcg by mouth daily., Disp: , Rfl:  .  ketoconazole (NIZORAL) 2 % shampoo, Apply 1 application topically 2 (two) times a week. , Disp: , Rfl:  .  methylPREDNISolone (MEDROL DOSEPAK) 4 MG TBPK tablet, As directed, Disp: 21 tablet, Rfl: 0 .  Multiple Vitamin (MULTIVITAMIN) capsule, Take 1 capsule by mouth daily., Disp: , Rfl:  .  nitroGLYCERIN (NITROSTAT) 0.4 MG SL tablet, Place 1 tablet (0.4 mg total) under the tongue every 5 (five) minutes as needed for chest pain., Disp: 25 tablet, Rfl: 6 .  rosuvastatin (CRESTOR) 40 MG tablet, Take 1 tablet by mouth at bedtime, Disp: 90 tablet, Rfl: 1  EXAM:  VITALS per patient if applicable: RR between 81-44 bpm  GENERAL: alert, oriented, appears well and in no acute distress  HEENT: atraumatic, conjunctiva  clear, no obvious abnormalities on inspection of external nose and ears  NECK: normal movements of the head and neck  LUNGS: on inspection no signs of respiratory distress, breathing rate appears normal, no obvious gross SOB, gasping or wheezing  CV: no obvious cyanosis  MS: moves all visible extremities without noticeable abnormality  PSYCH/NEURO: pleasant and cooperative, no obvious depression or anxiety, speech and thought processing grossly intact  ASSESSMENT AND PLAN:  Discussed the following assessment and plan:  Exposure to Covid-19 Virus  -pt currently asymptomatic.  Monitor for s/s -education provided -As out of town discussed ways to find testing sites.  Given info for a [facility] close by that is offering testing.  Can also contact HD.  F/u prn   I discussed the assessment and treatment plan with the patient. The  patient was provided an opportunity to ask questions and all were answered. The patient agreed with the plan and demonstrated an understanding of the instructions.   The patient was advised to call back or seek an in-person evaluation if the symptoms worsen or if the condition fails to improve as anticipated.  Billie Ruddy, MD

## 2019-04-24 ENCOUNTER — Telehealth: Payer: Self-pay | Admitting: Family Medicine

## 2019-04-24 ENCOUNTER — Other Ambulatory Visit: Payer: PPO

## 2019-04-24 DIAGNOSIS — Z20822 Contact with and (suspected) exposure to covid-19: Secondary | ICD-10-CM

## 2019-04-24 DIAGNOSIS — R6889 Other general symptoms and signs: Secondary | ICD-10-CM | POA: Diagnosis not present

## 2019-04-24 NOTE — Telephone Encounter (Signed)
Pt. Called and stated he was advised to call in to schedule COVID testing.  Noted in appt. Note on 6/26, that was advised to be scheduled for testing.  Scheduled appt. At 3:00 PM today @ [facility].  Advised to wear a mask and to remain in car for testing.  Pt. Verb. Understanding.

## 2019-04-24 NOTE — Telephone Encounter (Signed)
Spoke with pt given information for [facility] COVID-19 testing site

## 2019-04-24 NOTE — Telephone Encounter (Signed)
Patient is calling back to check status of his covid-19 order.  Patient did have virtual on Friday with banks, due to PCP being off on Friday.  Patient did discuss with Volanda Napoleon he needs to get a covid testing.  Patient did explain that he was exposured 7 days ago when he was playing golf.. Has no symptoms but is 68 years old and also was around family that was older.  Patient has Call back # [phone]

## 2019-04-29 LAB — NOVEL CORONAVIRUS, NAA: SARS-CoV-2, NAA: NOT DETECTED

## 2019-05-18 DIAGNOSIS — L820 Inflamed seborrheic keratosis: Secondary | ICD-10-CM | POA: Diagnosis not present

## 2019-05-18 DIAGNOSIS — L570 Actinic keratosis: Secondary | ICD-10-CM | POA: Diagnosis not present

## 2019-05-18 DIAGNOSIS — L814 Other melanin hyperpigmentation: Secondary | ICD-10-CM | POA: Diagnosis not present

## 2019-05-18 DIAGNOSIS — D1801 Hemangioma of skin and subcutaneous tissue: Secondary | ICD-10-CM | POA: Diagnosis not present

## 2019-05-18 DIAGNOSIS — D225 Melanocytic nevi of trunk: Secondary | ICD-10-CM | POA: Diagnosis not present

## 2019-05-18 DIAGNOSIS — L821 Other seborrheic keratosis: Secondary | ICD-10-CM | POA: Diagnosis not present

## 2019-05-18 DIAGNOSIS — L720 Epidermal cyst: Secondary | ICD-10-CM | POA: Diagnosis not present

## 2019-05-31 ENCOUNTER — Encounter: Payer: PPO | Admitting: Family Medicine

## 2019-05-31 ENCOUNTER — Ambulatory Visit: Payer: PPO

## 2019-06-02 DIAGNOSIS — D123 Benign neoplasm of transverse colon: Secondary | ICD-10-CM | POA: Diagnosis not present

## 2019-06-02 DIAGNOSIS — K5730 Diverticulosis of large intestine without perforation or abscess without bleeding: Secondary | ICD-10-CM | POA: Diagnosis not present

## 2019-06-02 DIAGNOSIS — Z86010 Personal history of colonic polyps: Secondary | ICD-10-CM | POA: Diagnosis not present

## 2019-06-02 DIAGNOSIS — D122 Benign neoplasm of ascending colon: Secondary | ICD-10-CM | POA: Diagnosis not present

## 2019-06-06 DIAGNOSIS — D122 Benign neoplasm of ascending colon: Secondary | ICD-10-CM | POA: Diagnosis not present

## 2019-06-06 DIAGNOSIS — D123 Benign neoplasm of transverse colon: Secondary | ICD-10-CM | POA: Diagnosis not present

## 2019-06-16 ENCOUNTER — Telehealth: Payer: Self-pay | Admitting: Cardiovascular Disease

## 2019-06-16 NOTE — Telephone Encounter (Signed)
New message:     Patient calling to get a appt with doctor Burt Knack. He do not have anything and patient which not to see a PA

## 2019-06-19 NOTE — Telephone Encounter (Signed)
Scheduled the patient for yearly evaluation with Dr. Burt Knack 9/17. He was grateful for call and agrees with treatment plan.

## 2019-06-20 MED ORDER — ROSUVASTATIN CALCIUM 40 MG PO TABS: 40.0000 mg | ORAL_TABLET | Freq: Every day | ORAL | 3 refills | Status: DC

## 2019-06-20 NOTE — Telephone Encounter (Signed)
The patient reports he is changing PCPs. He has a virtual visit later this week and will discuss labs. He requests a list of annual lab work he has been getting to share with Dr. Brigitte Pulse. He also requests a refill on his Crestor. Refill sent.  MyChart message sent to patient with list of labs he has gotten yearly. He was grateful for assistance.

## 2019-06-23 DIAGNOSIS — Z86010 Personal history of colonic polyps: Secondary | ICD-10-CM | POA: Diagnosis not present

## 2019-06-23 DIAGNOSIS — Z9861 Coronary angioplasty status: Secondary | ICD-10-CM | POA: Diagnosis not present

## 2019-06-23 DIAGNOSIS — E785 Hyperlipidemia, unspecified: Secondary | ICD-10-CM | POA: Diagnosis not present

## 2019-06-23 DIAGNOSIS — Z0000 Encounter for general adult medical examination without abnormal findings: Secondary | ICD-10-CM | POA: Diagnosis not present

## 2019-06-23 DIAGNOSIS — Z1331 Encounter for screening for depression: Secondary | ICD-10-CM | POA: Diagnosis not present

## 2019-06-27 DIAGNOSIS — Z23 Encounter for immunization: Secondary | ICD-10-CM | POA: Diagnosis not present

## 2019-06-29 ENCOUNTER — Other Ambulatory Visit: Payer: Self-pay | Admitting: Internal Medicine

## 2019-06-29 DIAGNOSIS — R82998 Other abnormal findings in urine: Secondary | ICD-10-CM | POA: Diagnosis not present

## 2019-06-29 DIAGNOSIS — Z0000 Encounter for general adult medical examination without abnormal findings: Secondary | ICD-10-CM

## 2019-06-29 DIAGNOSIS — Z23 Encounter for immunization: Secondary | ICD-10-CM | POA: Diagnosis not present

## 2019-07-10 ENCOUNTER — Ambulatory Visit
Admission: RE | Admit: 2019-07-10 | Discharge: 2019-07-10 | Disposition: A | Discharge status: Home / Self Care | Payer: PPO | Source: Ambulatory Visit | Attending: Internal Medicine | Admitting: Internal Medicine

## 2019-07-10 DIAGNOSIS — Z0000 Encounter for general adult medical examination without abnormal findings: Secondary | ICD-10-CM

## 2019-07-10 DIAGNOSIS — Z87891 Personal history of nicotine dependence: Secondary | ICD-10-CM | POA: Diagnosis not present

## 2019-07-10 DIAGNOSIS — Z136 Encounter for screening for cardiovascular disorders: Secondary | ICD-10-CM | POA: Diagnosis not present

## 2019-07-13 ENCOUNTER — Encounter: Payer: Self-pay | Admitting: Cardiovascular Disease

## 2019-07-13 ENCOUNTER — Ambulatory Visit: Payer: PPO | Admitting: Cardiovascular Disease

## 2019-07-13 ENCOUNTER — Other Ambulatory Visit: Payer: Self-pay

## 2019-07-13 VITALS — BP 108/62 | HR 58 | Ht 76.0 in | Wt 196.4 lb

## 2019-07-13 DIAGNOSIS — E782 Mixed hyperlipidemia: Secondary | ICD-10-CM

## 2019-07-13 DIAGNOSIS — I2510 Atherosclerotic heart disease of native coronary artery without angina pectoris: Secondary | ICD-10-CM

## 2019-07-13 NOTE — Patient Instructions (Signed)
Medication Instructions:  Your provider recommends that you continue on your current medications as directed. Please refer to the Current Medication list given to you today.    Labwork: None  Testing/Procedures: None  Follow-Up: Your provider wants you to follow-up in: 1 year with Dr. Cooper. You will receive a reminder letter in the mail two months in advance. If you don't receive a letter, please call our office to schedule the follow-up appointment.    

## 2019-07-13 NOTE — Progress Notes (Signed)
Cardiology Office Note:    Date:  07/13/2019   ID:  Jordan [name], DOB 1951/07/02, MRN [id number]  PCP:  Marton Redwood, MD  Cardiologist:  Sherren Mocha, MD  Electrophysiologist:  None   Referring MD: Eulas Post, MD   Chief Complaint  Patient presents with  . Coronary Artery Disease    History of Present Illness:    Jordan [name] is a 68 y.o. male with a hx of coronary artery disease presenting for follow-up evaluation.  The patient underwent PCI of the LAD in 2004.  He presented with exertional angina and was found to have subtotal occlusion of the proximal LAD treated with a Cypher drug-eluting stent.  He last underwent stress testing in 2017 when he had an abnormal EKG response with a known history of positive exercise ECGs.  His myocardial perfusion scan was normal and he had no symptoms with exercise.  Ongoing medical management was recommended.  The patient is here alone today.  He is doing well with no symptoms of chest pain, chest pressure, shortness of breath, heart palpitations, lightheadedness, or syncope.  He has had no leg swelling, orthopnea, or PND.  He has been playing golf regularly with no exertional symptoms.  Past Medical History:  Diagnosis Date  . Colon polyps   . Heart disease   . Hyperlipidemia   . S/P coronary artery stent placement     Past Surgical History:  Procedure Laterality Date  . COLONOSCOPY    . heart stent  2004  . HERNIA REPAIR  2004   right  . POLYPECTOMY    . SPINE SURGERY  2001   microendoscopic discectomy    Current Medications: Current Meds  Medication Sig  . aspirin EC 81 MG tablet Take 160 mg by mouth. 2 tablets daily   . folic acid (FOLVITE) A999333 MCG tablet Take 400 mcg by mouth daily.  [name] ketoconazole (NIZORAL) 2 % shampoo Apply 1 application topically 2 (two) times a week.   . Multiple Vitamin (MULTIVITAMIN) capsule Take 1 capsule by mouth daily.  . nitroGLYCERIN (NITROSTAT) 0.4 MG SL tablet Place 1 tablet  (0.4 mg total) under the tongue every 5 (five) minutes as needed for chest pain.  . rosuvastatin (CRESTOR) 40 MG tablet Take 1 tablet (40 mg total) by mouth at bedtime.     Allergies:   Patient has no known allergies.   Social History   Socioeconomic History  . Marital status: Married    Spouse name: Not on file  . Number of children: Not on file  . Years of education: Not on file  . Highest education level: Not on file  Occupational History  . Not on file  Social Needs  . Financial resource strain: Not on file  . Food insecurity    Worry: Not on file    Inability: Not on file  . Transportation needs    Medical: Not on file    Non-medical: Not on file  Tobacco Use  . Smoking status: Current Some Day Smoker    Types: Cigars  . Smokeless tobacco: Never Used  . Tobacco comment: 20-25 cigars per year  Substance and Sexual Activity  . Alcohol use: Yes    Comment: 0 -2 drinks per week  . Drug use: No  . Sexual activity: Not on file  Lifestyle  . Physical activity    Days per week: Not on file    Minutes per session: Not on file  . Stress: Not on  file  Relationships  . Social Herbalist on phone: Not on file    Gets together: Not on file    Attends religious service: Not on file    Active member of club or organization: Not on file    Attends meetings of clubs or organizations: Not on file    Relationship status: Not on file  Other Topics Concern  . Not on file  Social History Narrative  . Not on file     Family History: The patient's family history includes Heart attack in an other family member; Heart attack (age of onset: 55) in his brother; Heart attack (age of onset: 88) in his father; Mitral valve prolapse in his mother; Stroke (age of onset: 82) in an other family member. There is no history of Colon cancer, Stomach cancer, Colon polyps, Esophageal cancer, or Rectal cancer.  ROS:   Please see the history of present illness.    All other systems  reviewed and are negative.  EKGs/Labs/Other Studies Reviewed:    The following studies were reviewed today: US Aorta: FINDINGS: Abdominal aortic measurements as follows:  Proximal:  2.6 cm  Mid:  2.8 cm  Distal:  2.2 cm  IMPRESSION: No sonographic evidence of abdominal aortic aneurysm.  EKG:  EKG is ordered today.  The ekg ordered today demonstrates sinus bradycardia 58 bpm, rightward axis, occasional PVC  Recent Labs: No results found for requested labs within last 8760 hours.  Recent Lipid Panel    Component Value Date/Time   CHOL 127 05/25/2018 0900   TRIG 80.0 05/25/2018 0900   HDL 44.80 05/25/2018 0900   CHOLHDL 3 05/25/2018 0900   VLDL 16.0 05/25/2018 0900   LDLCALC 66 05/25/2018 0900    Physical Exam:    VS:  BP 108/62   Pulse (!) 58   Ht 6\' 4"  (1.93 m)   Wt 196 lb 6.4 oz (89.1 kg)   SpO2 98%   BMI 23.91 kg/m     Wt Readings from Last 3 Encounters:  07/13/19 196 lb 6.4 oz (89.1 kg)  11/10/18 207 lb 8 oz (94.1 kg)  10/28/18 209 lb 9.6 oz (95.1 kg)     GEN:  Well nourished, well developed in no acute distress HEENT: Normal NECK: No JVD; No carotid bruits LYMPHATICS: No lymphadenopathy CARDIAC: RRR, no murmurs, rubs, gallops RESPIRATORY:  Clear to auscultation without rales, wheezing or rhonchi  ABDOMEN: Soft, non-tender, non-distended MUSCULOSKELETAL:  No edema; No deformity  SKIN: Warm and dry NEUROLOGIC:  Alert and oriented x 3 PSYCHIATRIC:  Normal affect   ASSESSMENT:    1. Coronary artery disease involving native coronary artery of native heart without angina pectoris   2. Mixed hyperlipidemia    PLAN:    In order of problems listed above:  1. The patient is stable without symptoms of angina.  His medical program is reviewed and no changes are made. 2. Most recent lipids are reviewed.  LDL cholesterol is 77, HDL cholesterol is 39, total cholesterol 127, triglycerides 55.  Continue rosuvastatin 40 mg daily.  Overall the patient  appears to be doing very well.  He has occasional PVCs on exam and his EKG.  He is asymptomatic and this does not require any specific treatment.  He had an abdominal ultrasound performed since his last visit and this demonstrated no evidence of aneurysm.  I will plan to see him back in 1 year.  I did not make any changes in his medicines today.  Medication Adjustments/Labs and Tests Ordered: Current medicines are reviewed at length with the patient today.  Concerns regarding medicines are outlined above.  Orders Placed This Encounter  Procedures  . EKG 12-Lead   No orders of the defined types were placed in this encounter.   Patient Instructions  Medication Instructions:  Your provider recommends that you continue on your current medications as directed. Please refer to the Current Medication list given to you today.    Labwork: None  Testing/Procedures: None  Follow-Up: Your provider wants you to follow-up in: 1 year with Dr. Burt Knack. You will receive a reminder letter in the mail two months in advance. If you don't receive a letter, please call our office to schedule the follow-up appointment.      Signed, Sherren Mocha, MD  07/13/2019 2:35 PM    [place]

## 2019-08-15 DIAGNOSIS — D72819 Decreased white blood cell count, unspecified: Secondary | ICD-10-CM | POA: Diagnosis not present

## 2019-08-15 DIAGNOSIS — D696 Thrombocytopenia, unspecified: Secondary | ICD-10-CM | POA: Diagnosis not present

## 2019-08-25 ENCOUNTER — Telehealth: Payer: Self-pay

## 2019-08-25 MED ORDER — NITROGLYCERIN 0.4 MG SL SUBL: 0.4000 mg | SUBLINGUAL_TABLET | SUBLINGUAL | 6 refills | Status: DC | PRN

## 2019-08-25 NOTE — Telephone Encounter (Signed)
RX for nitroglycerin sent to pharmacy

## 2019-09-11 DIAGNOSIS — Z20818 Contact with and (suspected) exposure to other bacterial communicable diseases: Secondary | ICD-10-CM | POA: Diagnosis not present

## 2019-09-11 DIAGNOSIS — Z20828 Contact with and (suspected) exposure to other viral communicable diseases: Secondary | ICD-10-CM | POA: Diagnosis not present

## 2019-09-11 DIAGNOSIS — R0981 Nasal congestion: Secondary | ICD-10-CM | POA: Diagnosis not present

## 2019-09-29 DIAGNOSIS — M25521 Pain in right elbow: Secondary | ICD-10-CM | POA: Diagnosis not present

## 2019-10-09 DIAGNOSIS — M25521 Pain in right elbow: Secondary | ICD-10-CM | POA: Diagnosis not present

## 2019-11-17 ENCOUNTER — Ambulatory Visit: Payer: PPO | Attending: Internal Medicine

## 2019-11-17 DIAGNOSIS — Z23 Encounter for immunization: Secondary | ICD-10-CM | POA: Insufficient documentation

## 2019-11-17 NOTE — Progress Notes (Signed)
   Covid-19 Vaccination Clinic  Name:  Jordan [name]    MRN: [id number] DOB: May 02, 1951  11/17/2019  [name] was observed post Covid-19 immunization for 15 minutes without incidence. He was provided with Vaccine Information Sheet and instruction to access the V-Safe system.   [name] was instructed to call 911 with any severe reactions post vaccine: [name] Difficulty breathing  . Swelling of your face and throat  . A fast heartbeat  . A bad rash all over your body  . Dizziness and weakness    Immunizations Administered    Name Date Dose VIS Date Route   Pfizer COVID-19 Vaccine 11/17/2019  4:31 PM 0.3 mL 10/06/2019 Intramuscular   Manufacturer: [place]   Lot: [id number]   [place]: [id number]

## 2019-12-05 ENCOUNTER — Ambulatory Visit: Payer: PPO | Attending: Internal Medicine

## 2019-12-05 DIAGNOSIS — Z23 Encounter for immunization: Secondary | ICD-10-CM

## 2019-12-05 NOTE — Progress Notes (Signed)
   Covid-19 Vaccination Clinic  Name:  Jordan [name]    MRN: [id number] DOB: Oct 20, 1951  12/05/2019  [name] was observed post Covid-19 immunization for 15 minutes without incidence. He was provided with Vaccine Information Sheet and instruction to access the V-Safe system.   [name] was instructed to call 911 with any severe reactions post vaccine: [name] Difficulty breathing  . Swelling of your face and throat  . A fast heartbeat  . A bad rash all over your body  . Dizziness and weakness    Immunizations Administered    Name Date Dose VIS Date Route   Pfizer COVID-19 Vaccine 12/05/2019  2:19 PM 0.3 mL 10/06/2019 Intramuscular   Manufacturer: [place]   Lot: [id number]   [place]: [id number]

## 2019-12-11 DIAGNOSIS — T691XXA Chilblains, initial encounter: Secondary | ICD-10-CM | POA: Diagnosis not present

## 2019-12-12 ENCOUNTER — Ambulatory Visit: Payer: PPO

## 2019-12-30 IMAGING — US US ABDOMINAL AORTA SCREENING AAA
1 series · 14 of 20 positions shown · non-contrast
Comparison: None.

CLINICAL DATA: Male between 65-75 years of age with a smoking
history.

EXAM:
US ABDOMINAL AORTA MEDICARE SCREENING
TECHNIQUE: Ultrasound examination of the abdominal aorta was performed as a
screening evaluation for abdominal aortic aneurysm.

[Series 1: us abdominal aorta screening aaa · 0.22mm/px · 14 of 20 slices shown]
[im 1/20]
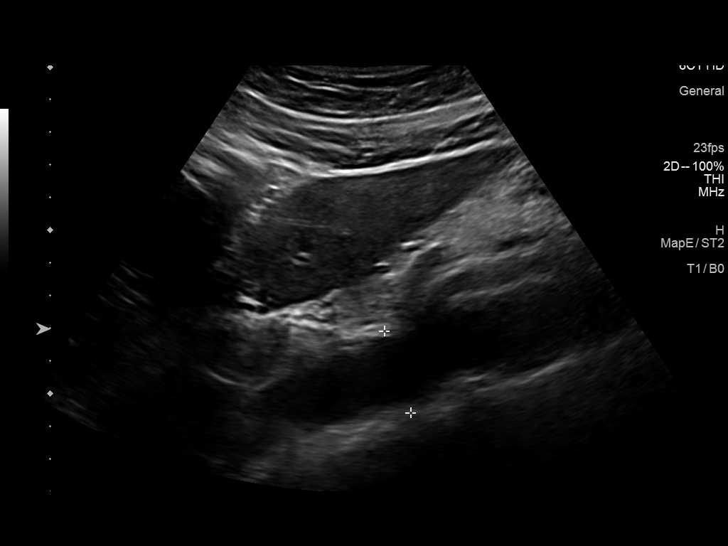
[im 3/20]
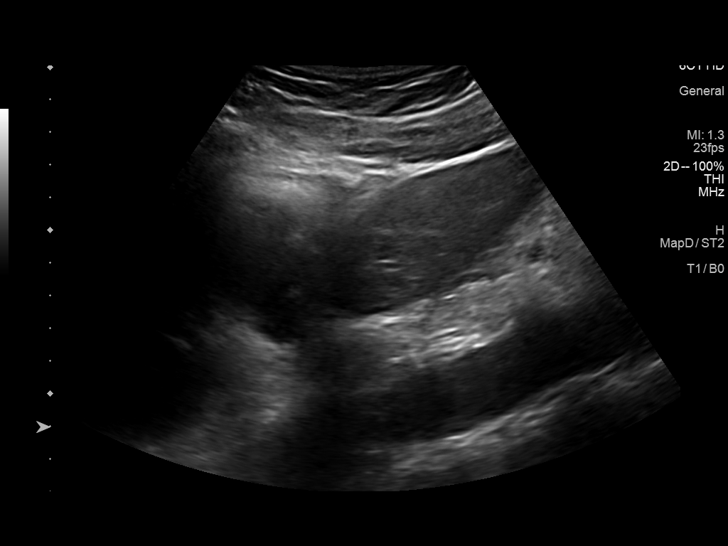
[im 4/20]
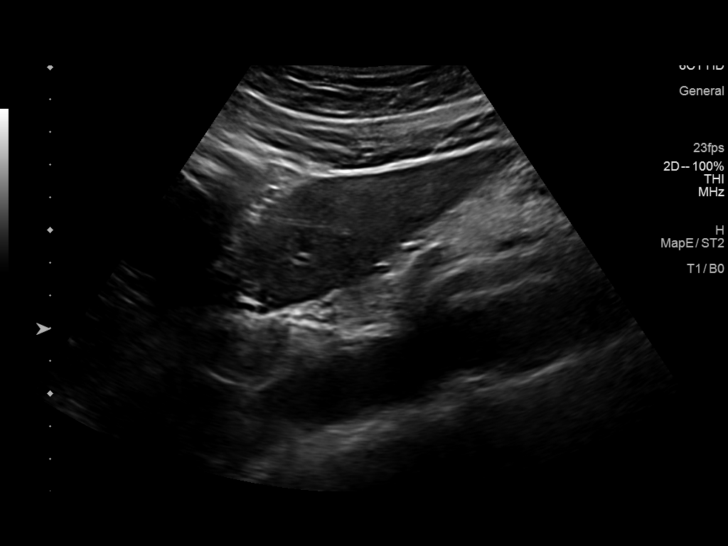
[im 6/20]
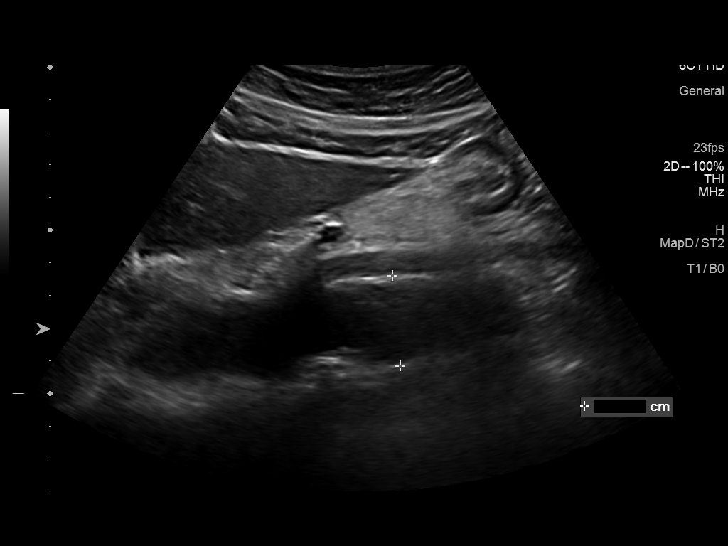
[im 7/20]
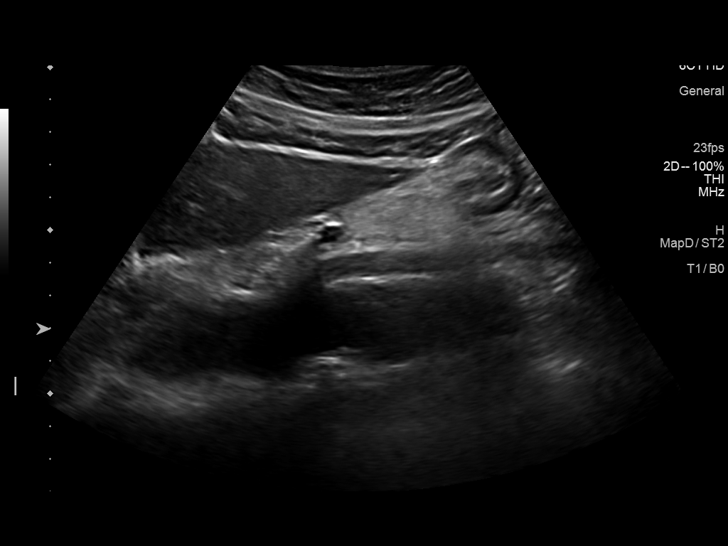
[im 8/20]
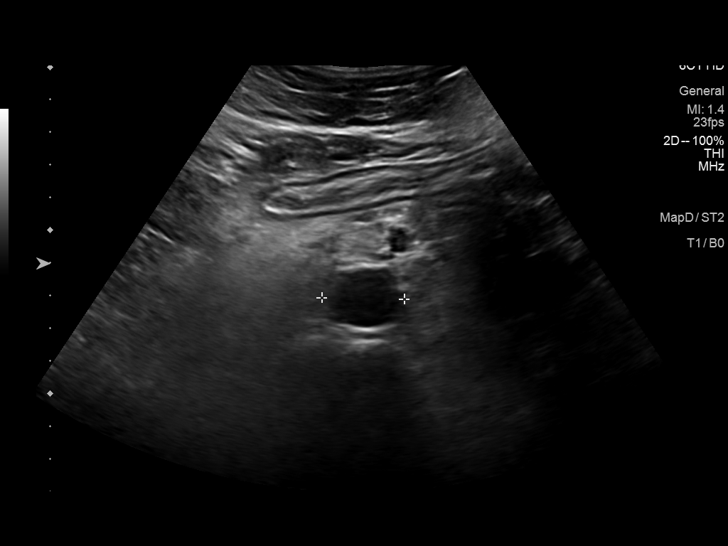
[im 10/20]
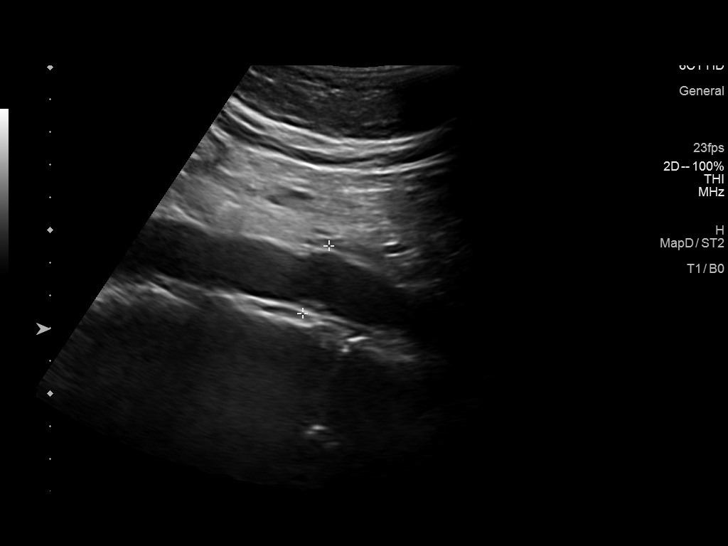
[im 11/20]
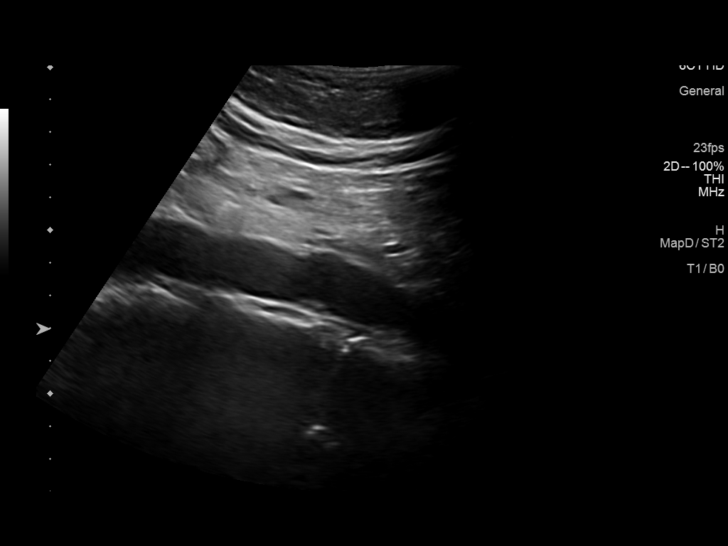
[im 13/20]
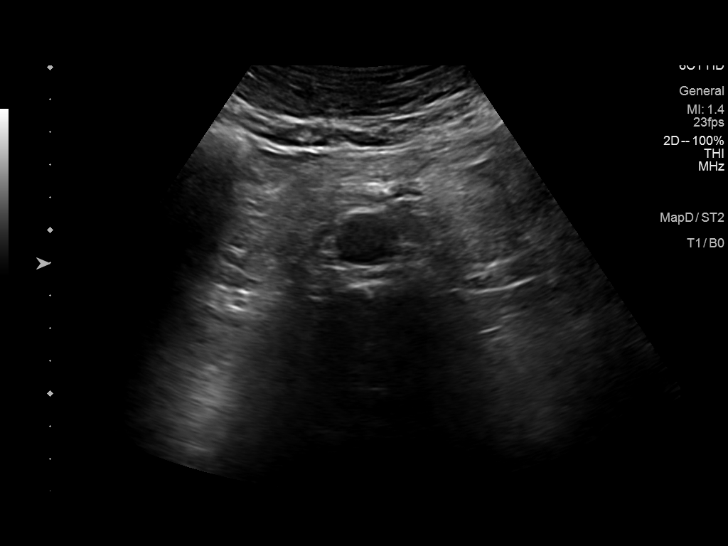
[im 14/20]
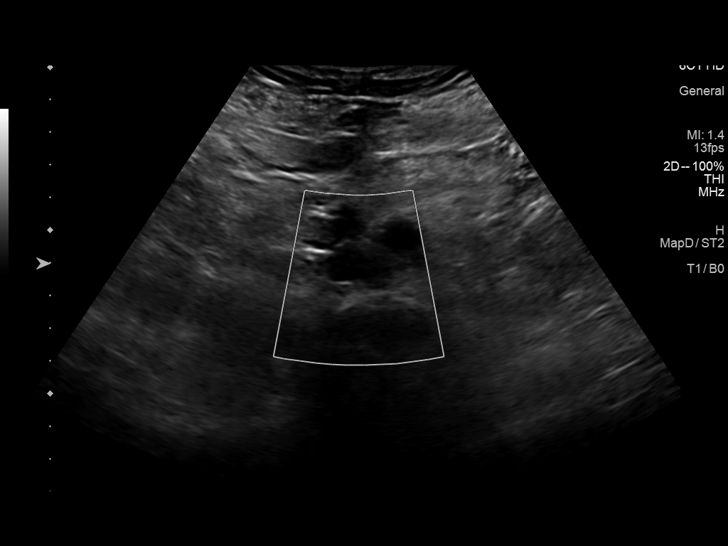
[im 16/20]
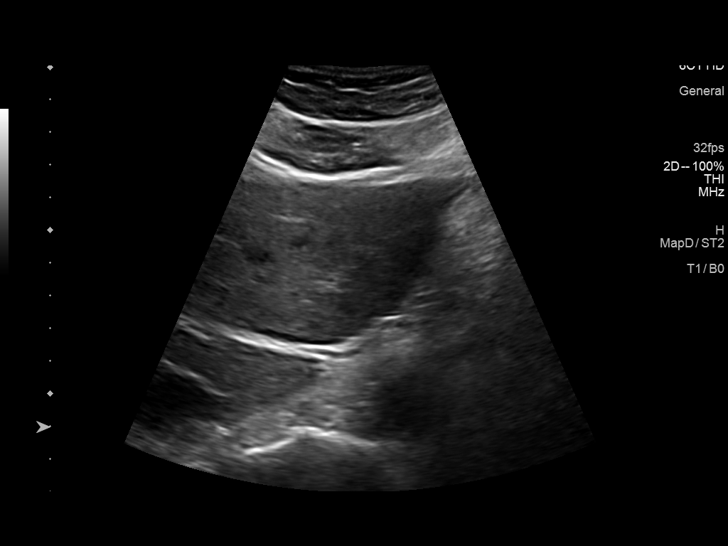
[im 17/20]
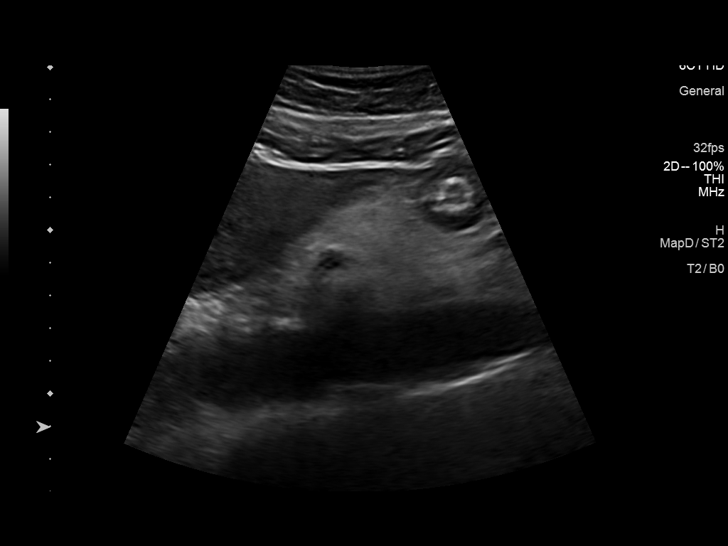
[im 18/20]
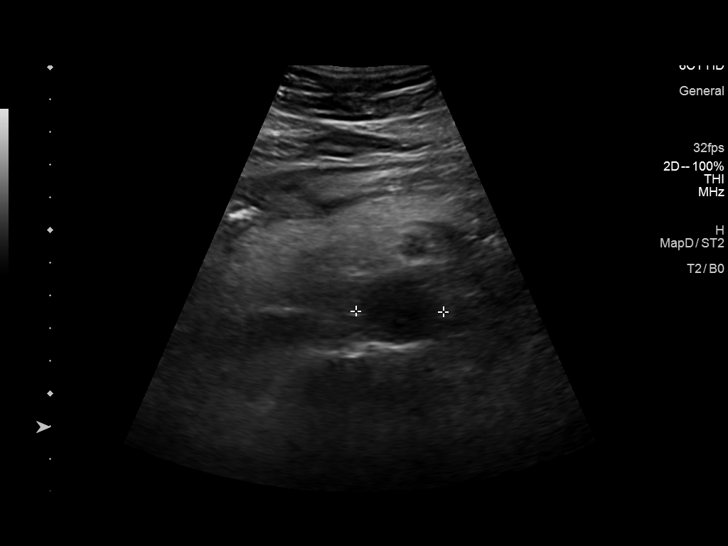
[im 20/20]
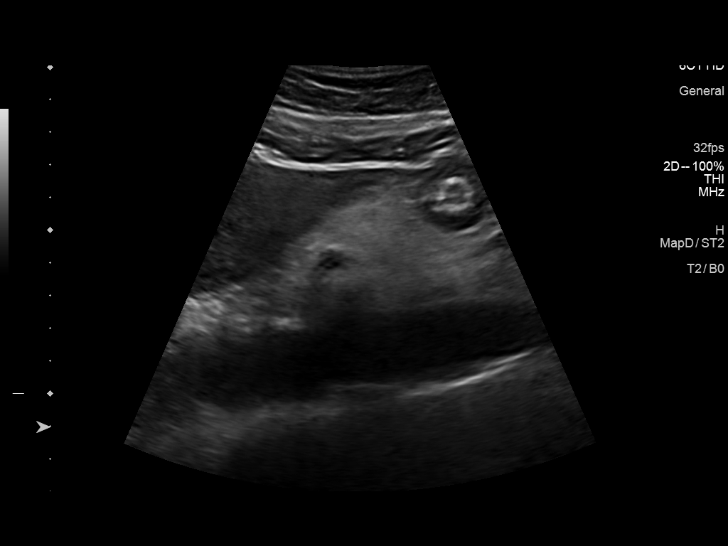

[14 of 20 positions shown; findings below may reference images not displayed]

FINDINGS: Abdominal aortic measurements as follows:

Proximal:  2.6 cm

Mid:  2.8 cm

Distal:  2.2 cm
IMPRESSION: No sonographic evidence of abdominal aortic aneurysm.

## 2020-05-15 ENCOUNTER — Telehealth: Payer: Self-pay | Admitting: Cardiovascular Disease

## 2020-05-15 NOTE — Telephone Encounter (Signed)
Jordan [name] is calling to schedule his 1 year annual f/u and states he is weary of waiting until November to schedule with Dr. Burt Knack for his first available due to it pushing his annual date for next year out that far as well for insurance purposes. He is requesting Valetta Fuller give him a call to see if their is anything she can do to get him worked in on the September schedule. I advised Christoffer I can check with the PA's for a sooner appt, but he states he would like to see Dr. Burt Knack. The appt he is requesting had been added to the wait list as well. Please advise.

## 2020-05-16 NOTE — Telephone Encounter (Signed)
Will call patient when earlier appointment becomes available.

## 2020-05-21 DIAGNOSIS — L218 Other seborrheic dermatitis: Secondary | ICD-10-CM | POA: Diagnosis not present

## 2020-05-21 DIAGNOSIS — L728 Other follicular cysts of the skin and subcutaneous tissue: Secondary | ICD-10-CM | POA: Diagnosis not present

## 2020-05-21 DIAGNOSIS — D225 Melanocytic nevi of trunk: Secondary | ICD-10-CM | POA: Diagnosis not present

## 2020-05-21 DIAGNOSIS — L814 Other melanin hyperpigmentation: Secondary | ICD-10-CM | POA: Diagnosis not present

## 2020-05-21 DIAGNOSIS — L821 Other seborrheic keratosis: Secondary | ICD-10-CM | POA: Diagnosis not present

## 2020-05-21 DIAGNOSIS — L570 Actinic keratosis: Secondary | ICD-10-CM | POA: Diagnosis not present

## 2020-05-23 NOTE — Telephone Encounter (Signed)
Scheduled patient October 4th with Dr. Burt Knack. Left message to call back/send MyChart message to confirm or cancel appointment.

## 2020-05-23 NOTE — Telephone Encounter (Signed)
Received confirmation that patient will take 10/4 visit.

## 2020-06-18 DIAGNOSIS — Z125 Encounter for screening for malignant neoplasm of prostate: Secondary | ICD-10-CM | POA: Diagnosis not present

## 2020-06-18 DIAGNOSIS — E785 Hyperlipidemia, unspecified: Secondary | ICD-10-CM | POA: Diagnosis not present

## 2020-06-20 DIAGNOSIS — Z1212 Encounter for screening for malignant neoplasm of rectum: Secondary | ICD-10-CM | POA: Diagnosis not present

## 2020-06-20 DIAGNOSIS — D696 Thrombocytopenia, unspecified: Secondary | ICD-10-CM | POA: Diagnosis not present

## 2020-06-20 DIAGNOSIS — R82998 Other abnormal findings in urine: Secondary | ICD-10-CM | POA: Diagnosis not present

## 2020-06-20 DIAGNOSIS — Z9861 Coronary angioplasty status: Secondary | ICD-10-CM | POA: Diagnosis not present

## 2020-06-20 DIAGNOSIS — Z86010 Personal history of colonic polyps: Secondary | ICD-10-CM | POA: Diagnosis not present

## 2020-06-20 DIAGNOSIS — I2510 Atherosclerotic heart disease of native coronary artery without angina pectoris: Secondary | ICD-10-CM | POA: Diagnosis not present

## 2020-06-20 DIAGNOSIS — M545 Low back pain: Secondary | ICD-10-CM | POA: Diagnosis not present

## 2020-06-20 DIAGNOSIS — E785 Hyperlipidemia, unspecified: Secondary | ICD-10-CM | POA: Diagnosis not present

## 2020-07-03 MED ORDER — ROSUVASTATIN CALCIUM 40 MG PO TABS: 40.0000 mg | ORAL_TABLET | Freq: Every day | ORAL | 0 refills | Status: DC

## 2020-07-29 ENCOUNTER — Ambulatory Visit (INDEPENDENT_AMBULATORY_CARE_PROVIDER_SITE_OTHER): Payer: PPO | Admitting: Cardiovascular Disease

## 2020-07-29 ENCOUNTER — Other Ambulatory Visit: Payer: Self-pay

## 2020-07-29 ENCOUNTER — Encounter: Payer: Self-pay | Admitting: Cardiovascular Disease

## 2020-07-29 VITALS — BP 110/60 | HR 66 | Ht 76.0 in | Wt 207.6 lb

## 2020-07-29 DIAGNOSIS — E782 Mixed hyperlipidemia: Secondary | ICD-10-CM | POA: Diagnosis not present

## 2020-07-29 DIAGNOSIS — I2510 Atherosclerotic heart disease of native coronary artery without angina pectoris: Secondary | ICD-10-CM

## 2020-07-29 NOTE — Patient Instructions (Signed)

## 2020-07-29 NOTE — Progress Notes (Signed)
Cardiology Office Note:    Date:  07/29/2020   ID:  Jordan [name], DOB April 18, 1951, MRN [id number]  PCP:  Marton Redwood, MD  [facility] Cardiologist:  Sherren Mocha, MD  [facility] Electrophysiologist:  None   Referring MD: Marton Redwood, MD   Chief Complaint  Patient presents with   Coronary Artery Disease   History of Present Illness:    Jordan [name] is a 69 y.o. male with a hx of coronary artery disease, presenting for follow-up evaluation.  The patient underwent PCI of the LAD in 2004.  He presented with exertional angina and was found to have subtotal occlusion of the proximal LAD treated with a Cypher drug-eluting stent.  He last underwent stress testing in 2017 when he had an abnormal EKG response with a known history of positive exercise ECGs.  His myocardial perfusion scan was normal and he had no symptoms with exercise.  Ongoing medical management was recommended.  The patient continues to do very well.  He is active, working out with a Electrical engineer.  He has no exertional symptoms.  He specifically denies chest pain, chest pressure, or shortness of breath.  No edema, orthopnea, PND, or heart palpitations.  Past Medical History:  Diagnosis Date   Colon polyps    Heart disease    Hyperlipidemia    S/P coronary artery stent placement     Past Surgical History:  Procedure Laterality Date   COLONOSCOPY     heart stent  2004   HERNIA REPAIR  2004   right   POLYPECTOMY     SPINE SURGERY  2001   microendoscopic discectomy    Current Medications: Current Meds  Medication Sig   aspirin EC 81 MG tablet Take 160 mg by mouth. 2 tablets daily    folic acid (FOLVITE) 621 MCG tablet Take 400 mcg by mouth daily.   ketoconazole (NIZORAL) 2 % shampoo Apply 1 application topically 2 (two) times a week.    Multiple Vitamin (MULTIVITAMIN) capsule Take 1 capsule by mouth daily.   nitroGLYCERIN (NITROSTAT) 0.4 MG SL tablet Place 1 tablet  (0.4 mg total) under the tongue every 5 (five) minutes as needed for chest pain.   rosuvastatin (CRESTOR) 40 MG tablet Take 1 tablet (40 mg total) by mouth at bedtime. Please keep upcoming appt in October for future refills. Thank you     Allergies:   Patient has no known allergies.   Social History   Socioeconomic History   Marital status: Married    Spouse name: Not on file   Number of children: Not on file   Years of education: Not on file   Highest education level: Not on file  Occupational History   Not on file  Tobacco Use   Smoking status: Current Some Day Smoker    Types: Cigars   Smokeless tobacco: Never Used   Tobacco comment: 20-25 cigars per year  Vaping Use   Vaping Use: Never used  Substance and Sexual Activity   Alcohol use: Yes    Comment: 0 -2 drinks per week   Drug use: No   Sexual activity: Not on file  Other Topics Concern   Not on file  Social History Narrative   Not on file   Social Determinants of Health   Financial Resource Strain:    Difficulty of Paying Living Expenses: Not on file  Food Insecurity:    Worried About [place] in the Last Year: Not  on file   [place] in the Last Year: Not on file  Transportation Needs:    Lack of Transportation (Medical): Not on file   Lack of Transportation (Non-Medical): Not on file  Physical Activity:    Days of Exercise per Week: Not on file   Minutes of Exercise per Session: Not on file  Stress:    Feeling of Stress : Not on file  Social Connections:    Frequency of Communication with Friends and Family: Not on file   Frequency of Social Gatherings with Friends and Family: Not on file   Attends Religious Services: Not on file   Active Member of Clubs or Organizations: Not on file   Attends Archivist Meetings: Not on file   Marital Status: Not on file     Family History: The patient's family history includes Heart attack in an other family  member; Heart attack (age of onset: 73) in his brother; Heart attack (age of onset: 42) in his father; Mitral valve prolapse in his mother; Stroke (age of onset: 29) in an other family member. There is no history of Colon cancer, Stomach cancer, Colon polyps, Esophageal cancer, or Rectal cancer.  ROS:   Please see the history of present illness.    All other systems reviewed and are negative.  EKGs/Labs/Other Studies Reviewed:    The following studies were reviewed today: Myocardial Perfusion Study (04/29/2016) Study Highlights   Nuclear stress EF: 59%.  Blood pressure demonstrated a hypertensive response to exercise.  There was 66mm of horizontal ST segment depression in the inferolateral leads at peak exercise. Changes resolved quickly in recovery.  Myocardial perfusion images are normal.  This is an intermediate risk study due to ischemic EKG changes at peak exercise.  The left ventricular ejection fraction is normal (55-65%).   EKG:  EKG is ordered today.  The ekg ordered today demonstrates normal sinus rhythm 66 bpm, occasional PVC, otherwise normal  Recent Labs: No results found for requested labs within last 8760 hours.  Recent Lipid Panel    Component Value Date/Time   CHOL 127 05/25/2018 0900   TRIG 80.0 05/25/2018 0900   HDL 44.80 05/25/2018 0900   CHOLHDL 3 05/25/2018 0900   VLDL 16.0 05/25/2018 0900   LDLCALC 66 05/25/2018 0900    Physical Exam:    VS:  BP 110/60    Pulse 66    Ht 6\' 4"  (1.93 m)    Wt 207 lb 9.6 oz (94.2 kg)    SpO2 96%    BMI 25.27 kg/m     Wt Readings from Last 3 Encounters:  07/29/20 207 lb 9.6 oz (94.2 kg)  07/13/19 196 lb 6.4 oz (89.1 kg)  11/10/18 207 lb 8 oz (94.1 kg)     GEN:  Well nourished, well developed in no acute distress HEENT: Normal NECK: No JVD; No carotid bruits LYMPHATICS: No lymphadenopathy CARDIAC: RRR, no murmurs, rubs, gallops RESPIRATORY:  Clear to auscultation without rales, wheezing or rhonchi  ABDOMEN:  Soft, non-tender, non-distended MUSCULOSKELETAL:  No edema; No deformity  SKIN: Warm and dry NEUROLOGIC:  Alert and oriented x 3 PSYCHIATRIC:  Normal affect   ASSESSMENT:    1. Coronary artery disease involving native coronary artery of native heart without angina pectoris   2. Mixed hyperlipidemia    PLAN:    In order of problems listed above:  1. Doing well at present, normotensive, continue aspirin and high intensity statin drug 2. Treated with rosuvastatin  40 mg daily.  Recent labs reviewed demonstrating glucose 91, creatinine 0.9, ALT 27, total cholesterol 129, triglycerides 70, HDL 44, LDL 71.   Medication Adjustments/Labs and Tests Ordered: Current medicines are reviewed at length with the patient today.  Concerns regarding medicines are outlined above.  No orders of the defined types were placed in this encounter.  No orders of the defined types were placed in this encounter.   There are no Patient Instructions on file for this visit.   Signed, Sherren Mocha, MD  07/29/2020 9:01 AM    [place]

## 2020-10-04 ENCOUNTER — Other Ambulatory Visit: Payer: Self-pay | Admitting: *Deleted

## 2020-10-04 MED ORDER — ROSUVASTATIN CALCIUM 40 MG PO TABS: 40.0000 mg | ORAL_TABLET | Freq: Every day | ORAL | 3 refills | Status: DC

## 2020-10-04 MED ORDER — NITROGLYCERIN 0.4 MG SL SUBL: 0.4000 mg | SUBLINGUAL_TABLET | SUBLINGUAL | 6 refills | Status: DC | PRN

## 2020-10-07 DIAGNOSIS — H43813 Vitreous degeneration, bilateral: Secondary | ICD-10-CM | POA: Diagnosis not present

## 2020-10-07 DIAGNOSIS — H52203 Unspecified astigmatism, bilateral: Secondary | ICD-10-CM | POA: Diagnosis not present

## 2020-10-07 DIAGNOSIS — H2513 Age-related nuclear cataract, bilateral: Secondary | ICD-10-CM | POA: Diagnosis not present

## 2020-10-07 DIAGNOSIS — H5213 Myopia, bilateral: Secondary | ICD-10-CM | POA: Diagnosis not present

## 2020-10-22 DIAGNOSIS — Z20822 Contact with and (suspected) exposure to covid-19: Secondary | ICD-10-CM | POA: Diagnosis not present

## 2020-12-06 DIAGNOSIS — B351 Tinea unguium: Secondary | ICD-10-CM | POA: Diagnosis not present

## 2020-12-06 DIAGNOSIS — D485 Neoplasm of uncertain behavior of skin: Secondary | ICD-10-CM | POA: Diagnosis not present

## 2020-12-06 DIAGNOSIS — L84 Corns and callosities: Secondary | ICD-10-CM | POA: Diagnosis not present

## 2021-03-12 DIAGNOSIS — B351 Tinea unguium: Secondary | ICD-10-CM | POA: Diagnosis not present

## 2021-03-12 DIAGNOSIS — L609 Nail disorder, unspecified: Secondary | ICD-10-CM | POA: Diagnosis not present

## 2021-03-12 DIAGNOSIS — R21 Rash and other nonspecific skin eruption: Secondary | ICD-10-CM | POA: Diagnosis not present

## 2021-03-12 DIAGNOSIS — L720 Epidermal cyst: Secondary | ICD-10-CM | POA: Diagnosis not present

## 2021-05-20 DIAGNOSIS — L821 Other seborrheic keratosis: Secondary | ICD-10-CM | POA: Diagnosis not present

## 2021-05-20 DIAGNOSIS — L814 Other melanin hyperpigmentation: Secondary | ICD-10-CM | POA: Diagnosis not present

## 2021-05-20 DIAGNOSIS — L905 Scar conditions and fibrosis of skin: Secondary | ICD-10-CM | POA: Diagnosis not present

## 2021-05-20 DIAGNOSIS — D1801 Hemangioma of skin and subcutaneous tissue: Secondary | ICD-10-CM | POA: Diagnosis not present

## 2021-05-20 DIAGNOSIS — D225 Melanocytic nevi of trunk: Secondary | ICD-10-CM | POA: Diagnosis not present

## 2021-05-20 DIAGNOSIS — L720 Epidermal cyst: Secondary | ICD-10-CM | POA: Diagnosis not present

## 2021-05-20 DIAGNOSIS — L570 Actinic keratosis: Secondary | ICD-10-CM | POA: Diagnosis not present

## 2021-05-20 DIAGNOSIS — B351 Tinea unguium: Secondary | ICD-10-CM | POA: Diagnosis not present

## 2021-06-23 DIAGNOSIS — Z125 Encounter for screening for malignant neoplasm of prostate: Secondary | ICD-10-CM | POA: Diagnosis not present

## 2021-06-23 DIAGNOSIS — R82998 Other abnormal findings in urine: Secondary | ICD-10-CM | POA: Diagnosis not present

## 2021-06-23 DIAGNOSIS — E785 Hyperlipidemia, unspecified: Secondary | ICD-10-CM | POA: Diagnosis not present

## 2021-07-10 DIAGNOSIS — M545 Low back pain, unspecified: Secondary | ICD-10-CM | POA: Diagnosis not present

## 2021-07-10 DIAGNOSIS — Z9861 Coronary angioplasty status: Secondary | ICD-10-CM | POA: Diagnosis not present

## 2021-07-10 DIAGNOSIS — Z1331 Encounter for screening for depression: Secondary | ICD-10-CM | POA: Diagnosis not present

## 2021-07-10 DIAGNOSIS — E785 Hyperlipidemia, unspecified: Secondary | ICD-10-CM | POA: Diagnosis not present

## 2021-07-10 DIAGNOSIS — Z1339 Encounter for screening examination for other mental health and behavioral disorders: Secondary | ICD-10-CM | POA: Diagnosis not present

## 2021-07-10 DIAGNOSIS — I2510 Atherosclerotic heart disease of native coronary artery without angina pectoris: Secondary | ICD-10-CM | POA: Diagnosis not present

## 2021-07-10 DIAGNOSIS — Z86010 Personal history of colonic polyps: Secondary | ICD-10-CM | POA: Diagnosis not present

## 2021-07-10 DIAGNOSIS — D696 Thrombocytopenia, unspecified: Secondary | ICD-10-CM | POA: Diagnosis not present

## 2021-07-10 DIAGNOSIS — B351 Tinea unguium: Secondary | ICD-10-CM | POA: Diagnosis not present

## 2021-07-10 DIAGNOSIS — T691XXA Chilblains, initial encounter: Secondary | ICD-10-CM | POA: Diagnosis not present

## 2021-07-10 DIAGNOSIS — Z0000 Encounter for general adult medical examination without abnormal findings: Secondary | ICD-10-CM | POA: Diagnosis not present

## 2021-08-04 ENCOUNTER — Ambulatory Visit: Payer: PPO | Admitting: Cardiovascular Disease

## 2021-08-04 ENCOUNTER — Other Ambulatory Visit: Payer: Self-pay

## 2021-08-04 ENCOUNTER — Encounter: Payer: Self-pay | Admitting: Cardiovascular Disease

## 2021-08-04 VITALS — BP 106/62 | HR 62 | Ht 76.0 in | Wt 209.0 lb

## 2021-08-04 DIAGNOSIS — I2510 Atherosclerotic heart disease of native coronary artery without angina pectoris: Secondary | ICD-10-CM | POA: Diagnosis not present

## 2021-08-04 DIAGNOSIS — E782 Mixed hyperlipidemia: Secondary | ICD-10-CM | POA: Diagnosis not present

## 2021-08-04 NOTE — Progress Notes (Signed)
Cardiology Office Note:    Date:  08/04/2021   ID:  Jordan [name], DOB Jun 09, 1951, MRN [id number]  PCP:  Ginger Organ., MD   [facility] Providers Cardiologist:  Sherren Mocha, MD     Referring MD: Ginger Organ., MD   Chief Complaint  Patient presents with   Coronary Artery Disease    History of Present Illness:    Jordan [name] is a 70 y.o. male with a hx of coronary artery disease, presenting for follow-up evaluation.  The patient underwent PCI with a Cypher DES in 2004 when he was found to have subtotal occlusion of his proximal LAD.  He has had no recurrent ischemic events since that time.  He has a known history of false positive EKG response with stress testing.  His last stress test in 2017 showed normal myocardial perfusion.  He is here alone today. He's been doing very well. He took a big family trip to [place] this past summer and they had a great time. He's still working out with a trainer on a regular basis and has no exertional symptoms. Also playing golf on a regularly. Today, he denies symptoms of palpitations, chest pain, shortness of breath, orthopnea, PND, lower extremity edema, dizziness, or syncope.  Past Medical History:  Diagnosis Date   Colon polyps    Heart disease    Hyperlipidemia    S/P coronary artery stent placement     Past Surgical History:  Procedure Laterality Date   COLONOSCOPY     heart stent  2004   HERNIA REPAIR  2004   right   POLYPECTOMY     SPINE SURGERY  2001   microendoscopic discectomy    Current Medications: Current Meds  Medication Sig   aspirin EC 81 MG tablet Take 160 mg by mouth. 2 tablets daily    betamethasone dipropionate 0.05 % cream Apply topically 2 (two) times daily. Treatment for toe fungus   ciclopirox (PENLAC) 8 % solution Apply topically.   folic acid (FOLVITE) 124 MCG tablet Take 400 mcg by mouth daily.   ketoconazole (NIZORAL) 2 % shampoo Apply 1 application topically 2 (two) times a  week.    Multiple Vitamin (MULTIVITAMIN) capsule Take 1 capsule by mouth daily.   nitroGLYCERIN (NITROSTAT) 0.4 MG SL tablet Place 1 tablet (0.4 mg total) under the tongue every 5 (five) minutes as needed for chest pain.   rosuvastatin (CRESTOR) 40 MG tablet Take 1 tablet (40 mg total) by mouth at bedtime.   terbinafine (LAMISIL) 250 MG tablet Take 250 mg by mouth daily.     Allergies:   Patient has no known allergies.   Social History   Socioeconomic History   Marital status: Married    Spouse name: Not on file   Number of children: Not on file   Years of education: Not on file   Highest education level: Not on file  Occupational History   Not on file  Tobacco Use   Smoking status: Some Days    Types: Cigars   Smokeless tobacco: Never   Tobacco comments:    20-25 cigars per year  Vaping Use   Vaping Use: Never used  Substance and Sexual Activity   Alcohol use: Yes    Comment: 0 -2 drinks per week   Drug use: No   Sexual activity: Not on file  Other Topics Concern   Not on file  Social History Narrative   Not on file  Social Determinants of Health   Financial Resource Strain: Not on file  Food Insecurity: Not on file  Transportation Needs: Not on file  Physical Activity: Not on file  Stress: Not on file  Social Connections: Not on file     Family History: The patient's family history includes Heart attack in an other family member; Heart attack (age of onset: 21) in his brother; Heart attack (age of onset: 61) in his father; Mitral valve prolapse in his mother; Stroke (age of onset: 54) in an other family member. There is no history of Colon cancer, Stomach cancer, Colon polyps, Esophageal cancer, or Rectal cancer.  ROS:   Please see the history of present illness.    All other systems reviewed and are negative.  EKGs/Labs/Other Studies Reviewed:    The following studies were reviewed today: Exercise stress Myoview 04/29/2016: Nuclear stress EF: 59%. Blood  pressure demonstrated a hypertensive response to exercise. There was 36mm of horizontal ST segment depression in the inferolateral leads at peak exercise. Changes resolved quickly in recovery. Myocardial perfusion images are normal. This is an intermediate risk study due to ischemic EKG changes at peak exercise. The left ventricular ejection fraction is normal (55-65%).  EKG:  EKG is ordered today.  The ekg ordered today demonstrates NSR 62 bpm, within normal limits  Recent Labs: No results found for requested labs within last 8760 hours.  Recent Lipid Panel    Component Value Date/Time   CHOL 127 05/25/2018 0900   TRIG 80.0 05/25/2018 0900   HDL 44.80 05/25/2018 0900   CHOLHDL 3 05/25/2018 0900   VLDL 16.0 05/25/2018 0900   LDLCALC 66 05/25/2018 0900     Risk Assessment/Calculations:      Physical Exam:    VS:  BP 106/62   Pulse 62   Ht 6\' 4"  (1.93 m)   Wt 209 lb (94.8 kg)   SpO2 96%   BMI 25.44 kg/m     Wt Readings from Last 3 Encounters:  08/04/21 209 lb (94.8 kg)  07/29/20 207 lb 9.6 oz (94.2 kg)  07/13/19 196 lb 6.4 oz (89.1 kg)     GEN:  Well nourished, well developed in no acute distress HEENT: Normal NECK: No JVD; No carotid bruits LYMPHATICS: No lymphadenopathy CARDIAC: RRR, no murmurs, rubs, gallops RESPIRATORY:  Clear to auscultation without rales, wheezing or rhonchi  ABDOMEN: Soft, non-tender, non-distended MUSCULOSKELETAL:  No edema; No deformity  SKIN: Warm and dry NEUROLOGIC:  Alert and oriented x 3 PSYCHIATRIC:  Normal affect   ASSESSMENT:    1. Coronary artery disease involving native coronary artery of native heart without angina pectoris   2. Mixed hyperlipidemia    PLAN:    In order of problems listed above:  Stable without symptoms of angina. Maintained on ASA and a high-intensity statin drug.  The patient brings in labs from primary care drawn June 23, 2021.  Transaminases are normal with an AST of 27 and ALT of 27.  Cholesterol  is 119, HDL 40, LDL 69, triglycerides 41. Follows an excellent diet/exercise program. Continue rosuvastatin 40 mg daily.    Medication Adjustments/Labs and Tests Ordered: Current medicines are reviewed at length with the patient today.  Concerns regarding medicines are outlined above.  Orders Placed This Encounter  Procedures   EKG 12-Lead    No orders of the defined types were placed in this encounter.   There are no Patient Instructions on file for this visit.   Signed, Sherren Mocha, MD  08/04/2021 8:48 AM     [facility]

## 2021-08-04 NOTE — Patient Instructions (Signed)
Medication Instructions:  Your physician recommends that you continue on your current medications as directed. Please refer to the Current Medication list given to you today.  *If you need a refill on your cardiac medications before your next appointment, please call your pharmacy*   Lab Work: none If you have labs (blood work) drawn today and your tests are completely normal, you will receive your results only by: [place] (if you have MyChart) OR A paper copy in the mail If you have any lab test that is abnormal or we need to change your treatment, we will call you to review the results.   Testing/Procedures: none   Follow-Up: At [facility], you and your health needs are our priority.  As part of our continuing mission to provide you with exceptional heart care, we have created designated Provider Care Teams.  These Care Teams include your primary Cardiologist (physician) and Advanced Practice Providers (APPs -  Physician Assistants and Nurse Practitioners) who all work together to provide you with the care you need, when you need it.  We recommend signing up for the patient portal called "MyChart".  Sign up information is provided on this After Visit Summary.  MyChart is used to connect with patients for Virtual Visits (Telemedicine).  Patients are able to view lab/test results, encounter notes, upcoming appointments, etc.  Non-urgent messages can be sent to your provider as well.   To learn more about what you can do with MyChart, go to [email or web address].    Your next appointment:   1 year(s)  The format for your next appointment:   In Person  Provider:   Sherren Mocha, MD   Other Instructions

## 2021-09-09 ENCOUNTER — Other Ambulatory Visit: Payer: Self-pay

## 2021-09-09 MED ORDER — ROSUVASTATIN CALCIUM 40 MG PO TABS: 40.0000 mg | ORAL_TABLET | Freq: Every day | ORAL | 3 refills | Status: DC

## 2021-09-24 DIAGNOSIS — Z6825 Body mass index (BMI) 25.0-25.9, adult: Secondary | ICD-10-CM | POA: Diagnosis not present

## 2021-09-24 DIAGNOSIS — T691XXA Chilblains, initial encounter: Secondary | ICD-10-CM | POA: Diagnosis not present

## 2021-09-24 DIAGNOSIS — E663 Overweight: Secondary | ICD-10-CM | POA: Diagnosis not present

## 2021-10-07 DIAGNOSIS — M25552 Pain in left hip: Secondary | ICD-10-CM | POA: Diagnosis not present

## 2021-10-07 DIAGNOSIS — M545 Low back pain, unspecified: Secondary | ICD-10-CM | POA: Diagnosis not present

## 2021-10-07 DIAGNOSIS — M1612 Unilateral primary osteoarthritis, left hip: Secondary | ICD-10-CM | POA: Diagnosis not present

## 2021-10-07 DIAGNOSIS — M5136 Other intervertebral disc degeneration, lumbar region: Secondary | ICD-10-CM | POA: Diagnosis not present

## 2021-10-08 DIAGNOSIS — H2513 Age-related nuclear cataract, bilateral: Secondary | ICD-10-CM | POA: Diagnosis not present

## 2021-10-08 DIAGNOSIS — H524 Presbyopia: Secondary | ICD-10-CM | POA: Diagnosis not present

## 2021-10-08 DIAGNOSIS — H04123 Dry eye syndrome of bilateral lacrimal glands: Secondary | ICD-10-CM | POA: Diagnosis not present

## 2021-10-08 DIAGNOSIS — H43813 Vitreous degeneration, bilateral: Secondary | ICD-10-CM | POA: Diagnosis not present

## 2022-07-09 ENCOUNTER — Telehealth: Payer: Self-pay | Admitting: Cardiovascular Disease

## 2022-07-09 NOTE — Telephone Encounter (Signed)
New Message:     Patient says he would like for Dr Antionette Char nurse to please give him a call. He have some questions about his office visit that he will have on 11-15-*23. s

## 2022-07-10 NOTE — Telephone Encounter (Signed)
Called and spoke with patient who was questioning if he needed to change his appt scheduled w/[facility] on 08/10/22 @ 10:40 d/t getting labs at PCP office. Informed pt that labs would be completed within 90 days, which would be fine for our records. Nor further questions.

## 2022-07-21 DIAGNOSIS — Z125 Encounter for screening for malignant neoplasm of prostate: Secondary | ICD-10-CM | POA: Diagnosis not present

## 2022-07-21 DIAGNOSIS — R7989 Other specified abnormal findings of blood chemistry: Secondary | ICD-10-CM | POA: Diagnosis not present

## 2022-07-21 DIAGNOSIS — I2510 Atherosclerotic heart disease of native coronary artery without angina pectoris: Secondary | ICD-10-CM | POA: Diagnosis not present

## 2022-07-21 DIAGNOSIS — E785 Hyperlipidemia, unspecified: Secondary | ICD-10-CM | POA: Diagnosis not present

## 2022-07-28 DIAGNOSIS — Z1331 Encounter for screening for depression: Secondary | ICD-10-CM | POA: Diagnosis not present

## 2022-07-28 DIAGNOSIS — Z23 Encounter for immunization: Secondary | ICD-10-CM | POA: Diagnosis not present

## 2022-07-28 DIAGNOSIS — E785 Hyperlipidemia, unspecified: Secondary | ICD-10-CM | POA: Diagnosis not present

## 2022-07-28 DIAGNOSIS — Z1339 Encounter for screening examination for other mental health and behavioral disorders: Secondary | ICD-10-CM | POA: Diagnosis not present

## 2022-07-28 DIAGNOSIS — Z0000 Encounter for general adult medical examination without abnormal findings: Secondary | ICD-10-CM | POA: Diagnosis not present

## 2022-07-28 DIAGNOSIS — I2510 Atherosclerotic heart disease of native coronary artery without angina pectoris: Secondary | ICD-10-CM | POA: Diagnosis not present

## 2022-07-28 DIAGNOSIS — Z86010 Personal history of colonic polyps: Secondary | ICD-10-CM | POA: Diagnosis not present

## 2022-07-28 DIAGNOSIS — M545 Low back pain, unspecified: Secondary | ICD-10-CM | POA: Diagnosis not present

## 2022-07-28 DIAGNOSIS — Z9861 Coronary angioplasty status: Secondary | ICD-10-CM | POA: Diagnosis not present

## 2022-07-28 DIAGNOSIS — M25552 Pain in left hip: Secondary | ICD-10-CM | POA: Diagnosis not present

## 2022-07-28 DIAGNOSIS — R82998 Other abnormal findings in urine: Secondary | ICD-10-CM | POA: Diagnosis not present

## 2022-07-28 DIAGNOSIS — T691XXA Chilblains, initial encounter: Secondary | ICD-10-CM | POA: Diagnosis not present

## 2022-07-28 DIAGNOSIS — B351 Tinea unguium: Secondary | ICD-10-CM | POA: Diagnosis not present

## 2022-08-10 ENCOUNTER — Encounter: Payer: Self-pay | Admitting: Cardiovascular Disease

## 2022-08-10 ENCOUNTER — Ambulatory Visit: Payer: PPO | Attending: Cardiovascular Disease | Admitting: Cardiovascular Disease

## 2022-08-10 VITALS — BP 120/72 | HR 57 | Ht 76.0 in | Wt 206.6 lb

## 2022-08-10 DIAGNOSIS — I2510 Atherosclerotic heart disease of native coronary artery without angina pectoris: Secondary | ICD-10-CM | POA: Diagnosis not present

## 2022-08-10 DIAGNOSIS — Z79899 Other long term (current) drug therapy: Secondary | ICD-10-CM

## 2022-08-10 DIAGNOSIS — E782 Mixed hyperlipidemia: Secondary | ICD-10-CM

## 2022-08-10 MED ORDER — EZETIMIBE 10 MG PO TABS: 10.0000 mg | ORAL_TABLET | Freq: Every day | ORAL | 3 refills | Status: DC

## 2022-08-10 NOTE — Progress Notes (Signed)
Cardiology Office Note:    Date:  08/10/2022   ID:  Jordan [name], DOB Dec 28, 1950, MRN [id number]  PCP:  Ginger Organ., MD   [place] Providers Cardiologist:  Sherren Mocha, MD     Referring MD: Ginger Organ., MD   Chief Complaint  Patient presents with   Coronary Artery Disease    History of Present Illness:    Jordan [name] is a 71 y.o. male with a hx of coronary artery disease, presenting for follow-up evaluation.  The patient underwent PCI with a Cypher DES in 2004 when he was found to have subtotal occlusion of his proximal LAD.  He has had no recurrent ischemic events since that time.  He has a known history of false positive EKG response with stress testing.  His last stress test in 2017 showed normal myocardial perfusion.  The patient is here alone today. He brings in labs from 07/16/2022: Chol 135, HDL 44, LDL 79, Trig 61. LFT's and renal function normal. Glucose 93.  He is doing well symptomatically.  He has had some problems with his low back, but has been able to remain physically active and works out with a physical therapist and a trainer on a regular basis.  He plays golf frequently.  He denies chest pain, chest pressure, or shortness of breath.  He has had no edema or heart palpitations.  Past Medical History:  Diagnosis Date   Colon polyps    Heart disease    Hyperlipidemia    S/P coronary artery stent placement     Past Surgical History:  Procedure Laterality Date   COLONOSCOPY     heart stent  2004   HERNIA REPAIR  2004   right   POLYPECTOMY     SPINE SURGERY  2001   microendoscopic discectomy    Current Medications: Current Meds  Medication Sig   Acetaminophen (TYLENOL 8 HOUR ARTHRITIS PAIN PO) Take 650 mg by mouth as needed. Per patient when playing golf.   aspirin EC 81 MG tablet Take 160 mg by mouth. 2 tablets daily    betamethasone dipropionate 0.05 % cream Apply topically 2 (two) times daily. Treatment for toe  fungus   ezetimibe (ZETIA) 10 MG tablet Take 1 tablet (10 mg total) by mouth daily.   folic acid (FOLVITE) 482 MCG tablet Take 400 mcg by mouth daily.   ketoconazole (NIZORAL) 2 % shampoo Apply 1 application topically 2 (two) times a week.    Methyl Salicylate 10 % PTCH Apply topically as needed. Per patient taking 2 patches at one time for hip and back for osteoarthritis.   Multiple Vitamin (MULTIVITAMIN) capsule Take 1 capsule by mouth daily.   nitroGLYCERIN (NITROSTAT) 0.4 MG SL tablet Place 1 tablet (0.4 mg total) under the tongue every 5 (five) minutes as needed for chest pain.   rosuvastatin (CRESTOR) 40 MG tablet Take 1 tablet (40 mg total) by mouth at bedtime.     Allergies:   Patient has no known allergies.   Social History   Socioeconomic History   Marital status: Married    Spouse name: Not on file   Number of children: Not on file   Years of education: Not on file   Highest education level: Not on file  Occupational History   Not on file  Tobacco Use   Smoking status: Some Days    Types: Cigars   Smokeless tobacco: Never   Tobacco comments:    20-25  cigars per year  Vaping Use   Vaping Use: Never used  Substance and Sexual Activity   Alcohol use: Yes    Comment: 0 -2 drinks per week   Drug use: No   Sexual activity: Not on file  Other Topics Concern   Not on file  Social History Narrative   Not on file   Social Determinants of Health   Financial Resource Strain: Not on file  Food Insecurity: Not on file  Transportation Needs: Not on file  Physical Activity: Not on file  Stress: Not on file  Social Connections: Not on file     Family History: The patient's family history includes Heart attack in an other family member; Heart attack (age of onset: 27) in his brother; Heart attack (age of onset: 46) in his father; Mitral valve prolapse in his mother; Stroke (age of onset: 51) in an other family member. There is no history of Colon cancer, Stomach cancer,  Colon polyps, Esophageal cancer, or Rectal cancer.  ROS:   Please see the history of present illness.    All other systems reviewed and are negative.  EKGs/Labs/Other Studies Reviewed:    EKG:  EKG is ordered today.  The ekg ordered today demonstrates sinus bradycardia 57 bpm, rightward axis, otherwise normal. No change from tracing 08-04-2021  Recent Labs: No results found for requested labs within last 365 days.  Recent Lipid Panel    Component Value Date/Time   CHOL 127 05/25/2018 0900   TRIG 80.0 05/25/2018 0900   HDL 44.80 05/25/2018 0900   CHOLHDL 3 05/25/2018 0900   VLDL 16.0 05/25/2018 0900   LDLCALC 66 05/25/2018 0900     Risk Assessment/Calculations:                Physical Exam:    VS:  BP 120/72   Pulse (!) 57   Ht '6\' 4"'$  (1.93 m)   Wt 206 lb 9.6 oz (93.7 kg)   SpO2 98%   BMI 25.15 kg/m     Wt Readings from Last 3 Encounters:  08/10/22 206 lb 9.6 oz (93.7 kg)  08/04/21 209 lb (94.8 kg)  07/29/20 207 lb 9.6 oz (94.2 kg)     GEN:  Well nourished, well developed in no acute distress HEENT: Normal NECK: No JVD; No carotid bruits LYMPHATICS: No lymphadenopathy CARDIAC: RRR, no murmurs, rubs, gallops RESPIRATORY:  Clear to auscultation without rales, wheezing or rhonchi  ABDOMEN: Soft, non-tender, non-distended MUSCULOSKELETAL:  No edema; No deformity  SKIN: Warm and dry NEUROLOGIC:  Alert and oriented x 3 PSYCHIATRIC:  Normal affect   ASSESSMENT:    1. Coronary artery disease involving native coronary artery of native heart without angina pectoris   2. Mixed hyperlipidemia   3. Medication management    PLAN:    In order of problems listed above:  Continues to do well with no functional limitation.  Treated with aspirin and high intensity statin drug. Lipids are reviewed.  Last year his LDL cholesterol was 71 mg/dL.  This year his LDL was 79 mg/dL.  Dr. Brigitte Pulse brought up the idea of adding [place] and I think this is indicated and appropriate.   He will continue rosuvastatin 40 mg daily and we will prescribe ezetimibe 10 mg daily.  He should have follow-up lipids and LFTs in 3 to 4 months.  Goal LDL cholesterol less than 70 mg/dL.  He follows a healthy lifestyle.     Medication Adjustments/Labs and Tests Ordered: Current medicines are  reviewed at length with the patient today.  Concerns regarding medicines are outlined above.  Orders Placed This Encounter  Procedures   Lipid panel   Hepatic function panel   EKG 12-Lead   Meds ordered this encounter  Medications   ezetimibe (ZETIA) 10 MG tablet    Sig: Take 1 tablet (10 mg total) by mouth daily.    Dispense:  90 tablet    Refill:  3    Patient Instructions  Medication Instructions:  START Zetia (Ezetimibe) daily *If you need a refill on your cardiac medications before your next appointment, please call your pharmacy*   Lab Work: Lipids, LFT's in 3 months If you have labs (blood work) drawn today and your tests are completely normal, you will receive your results only by: [place] (if you have MyChart) OR A paper copy in the mail If you have any lab test that is abnormal or we need to change your treatment, we will call you to review the results.   Testing/Procedures: NONE   Follow-Up: At [facility], you and your health needs are our priority.  As part of our continuing mission to provide you with exceptional heart care, we have created designated Provider Care Teams.  These Care Teams include your primary Cardiologist (physician) and Advanced Practice Providers (APPs -  Physician Assistants and Nurse Practitioners) who all work together to provide you with the care you need, when you need it.  We recommend signing up for the patient portal called "MyChart".  Sign up information is provided on this After Visit Summary.  MyChart is used to connect with patients for Virtual Visits (Telemedicine).  Patients are able to view lab/test results, encounter  notes, upcoming appointments, etc.  Non-urgent messages can be sent to your provider as well.   To learn more about what you can do with MyChart, go to [email or web address].    Your next appointment:   1 year(s)  The format for your next appointment:   In Person  Provider:   Sherren Mocha, MD       Important Information About Sugar         Signed, Sherren Mocha, MD  08/10/2022 5:25 PM    [place]

## 2022-08-10 NOTE — Patient Instructions (Signed)
Medication Instructions:  START Zetia (Ezetimibe) daily *If you need a refill on your cardiac medications before your next appointment, please call your pharmacy*   Lab Work: Lipids, LFT's in 3 months If you have labs (blood work) drawn today and your tests are completely normal, you will receive your results only by: [place] (if you have MyChart) OR A paper copy in the mail If you have any lab test that is abnormal or we need to change your treatment, we will call you to review the results.   Testing/Procedures: NONE   Follow-Up: At [facility], you and your health needs are our priority.  As part of our continuing mission to provide you with exceptional heart care, we have created designated Provider Care Teams.  These Care Teams include your primary Cardiologist (physician) and Advanced Practice Providers (APPs -  Physician Assistants and Nurse Practitioners) who all work together to provide you with the care you need, when you need it.  We recommend signing up for the patient portal called "MyChart".  Sign up information is provided on this After Visit Summary.  MyChart is used to connect with patients for Virtual Visits (Telemedicine).  Patients are able to view lab/test results, encounter notes, upcoming appointments, etc.  Non-urgent messages can be sent to your provider as well.   To learn more about what you can do with MyChart, go to [email or web address].    Your next appointment:   1 year(s)  The format for your next appointment:   In Person  Provider:   Sherren Mocha, MD       Important Information About Sugar

## 2022-08-12 ENCOUNTER — Encounter: Payer: Self-pay | Admitting: Cardiovascular Disease

## 2022-08-12 NOTE — Progress Notes (Signed)
Updated pt's med list per MyChart request.

## 2022-09-09 ENCOUNTER — Ambulatory Visit: Payer: PPO | Admitting: Cardiovascular Disease

## 2022-10-29 ENCOUNTER — Ambulatory Visit: Payer: PPO | Attending: Cardiovascular Disease

## 2022-10-29 DIAGNOSIS — E782 Mixed hyperlipidemia: Secondary | ICD-10-CM

## 2022-10-29 DIAGNOSIS — Z79899 Other long term (current) drug therapy: Secondary | ICD-10-CM

## 2022-10-29 LAB — LIPID PANEL
Chol/HDL Ratio: 2.7 ratio (ref 0.0–5.0)
Cholesterol, Total: 119 mg/dL (ref 100–199)
HDL: 44 mg/dL (ref >39)
LDL Chol Calc (NIH): 61 mg/dL (ref 0–99)
Triglycerides: 69 mg/dL (ref 0–149)
VLDL Cholesterol Cal: 14 mg/dL (ref 5–40)

## 2022-10-29 LAB — HEPATIC FUNCTION PANEL
ALT: 33 IU/L (ref 0–44)
AST: 30 IU/L (ref 0–40)
Albumin: 4.2 g/dL (ref 3.8–4.8)
Alkaline Phosphatase: 62 IU/L (ref 44–121)
Bilirubin Total: 0.7 mg/dL (ref 0.0–1.2)
Bilirubin, Direct: 0.23 mg/dL (ref 0.00–0.40)
Total Protein: 6.4 g/dL (ref 6.0–8.5)

## 2022-10-30 ENCOUNTER — Encounter: Payer: Self-pay | Admitting: Cardiovascular Disease

## 2022-10-30 MED ORDER — ROSUVASTATIN CALCIUM 40 MG PO TABS
40.0000 mg | ORAL_TABLET | Freq: Every day | ORAL | 3 refills | Status: DC
  Filled 2023-02-01 – 2023-05-03 (×2): qty 90, 90d supply, fill #0
  Filled 2023-07-27 – 2023-07-28 (×2): qty 90, 90d supply, fill #1

## 2022-11-05 ENCOUNTER — Telehealth: Payer: Self-pay | Admitting: Cardiovascular Disease

## 2022-11-05 MED ORDER — EZETIMIBE 10 MG PO TABS: 10.0000 mg | ORAL_TABLET | Freq: Every day | ORAL | 3 refills | Status: DC

## 2022-11-05 NOTE — Telephone Encounter (Signed)
-----   Message from Sherren Mocha, MD sent at 11/03/2022  3:04 PM EST ----- Labs excellent. Continue current Rx and follow-up as scheduled. thanks

## 2022-11-05 NOTE — Telephone Encounter (Signed)
Called and spoke with patient about results. He requests refill on zetia. Sending to pharmacy on file at this time.

## 2022-11-09 DIAGNOSIS — H5213 Myopia, bilateral: Secondary | ICD-10-CM | POA: Diagnosis not present

## 2022-11-09 DIAGNOSIS — H2513 Age-related nuclear cataract, bilateral: Secondary | ICD-10-CM | POA: Diagnosis not present

## 2022-12-14 DIAGNOSIS — S80811A Abrasion, right lower leg, initial encounter: Secondary | ICD-10-CM | POA: Diagnosis not present

## 2022-12-14 DIAGNOSIS — Z7189 Other specified counseling: Secondary | ICD-10-CM | POA: Diagnosis not present

## 2022-12-14 DIAGNOSIS — B078 Other viral warts: Secondary | ICD-10-CM | POA: Diagnosis not present

## 2022-12-14 DIAGNOSIS — L820 Inflamed seborrheic keratosis: Secondary | ICD-10-CM | POA: Diagnosis not present

## 2022-12-14 DIAGNOSIS — L814 Other melanin hyperpigmentation: Secondary | ICD-10-CM | POA: Diagnosis not present

## 2022-12-14 DIAGNOSIS — L918 Other hypertrophic disorders of the skin: Secondary | ICD-10-CM | POA: Diagnosis not present

## 2022-12-14 DIAGNOSIS — D225 Melanocytic nevi of trunk: Secondary | ICD-10-CM | POA: Diagnosis not present

## 2022-12-14 DIAGNOSIS — L218 Other seborrheic dermatitis: Secondary | ICD-10-CM | POA: Diagnosis not present

## 2022-12-14 DIAGNOSIS — L570 Actinic keratosis: Secondary | ICD-10-CM | POA: Diagnosis not present

## 2022-12-14 DIAGNOSIS — L821 Other seborrheic keratosis: Secondary | ICD-10-CM | POA: Diagnosis not present

## 2022-12-14 DIAGNOSIS — D294 Benign neoplasm of scrotum: Secondary | ICD-10-CM | POA: Diagnosis not present

## 2022-12-14 DIAGNOSIS — L728 Other follicular cysts of the skin and subcutaneous tissue: Secondary | ICD-10-CM | POA: Diagnosis not present

## 2023-01-13 ENCOUNTER — Other Ambulatory Visit: Payer: Self-pay

## 2023-01-14 ENCOUNTER — Other Ambulatory Visit: Payer: Self-pay

## 2023-01-14 ENCOUNTER — Other Ambulatory Visit (HOSPITAL_COMMUNITY): Payer: Self-pay

## 2023-01-14 ENCOUNTER — Other Ambulatory Visit: Payer: Self-pay | Admitting: Cardiovascular Disease

## 2023-01-14 MED ORDER — KETOCONAZOLE 2 % EX SHAM
MEDICATED_SHAMPOO | CUTANEOUS | 2 refills | Status: DC
  Filled 2023-01-14 (×2): qty 120, 30d supply, fill #0
  Filled 2023-11-25: qty 120, 30d supply, fill #1

## 2023-01-14 MED ORDER — EZETIMIBE 10 MG PO TABS
10.0000 mg | ORAL_TABLET | Freq: Every day | ORAL | 1 refills | Status: DC
  Filled 2023-02-01 – 2023-05-03 (×2): qty 90, 90d supply, fill #0

## 2023-01-14 MED ORDER — NITROGLYCERIN 0.4 MG SL SUBL
0.4000 mg | SUBLINGUAL_TABLET | SUBLINGUAL | 6 refills | Status: DC | PRN
  Filled 2023-01-14: qty 25, 30d supply, fill #0
  Filled 2023-01-14: qty 25, 9d supply, fill #0
  Filled 2023-01-27 (×2): qty 25, 1d supply, fill #0
  Filled 2023-06-15: qty 25, 1d supply, fill #1

## 2023-01-15 ENCOUNTER — Encounter (HOSPITAL_COMMUNITY): Payer: Self-pay

## 2023-01-15 ENCOUNTER — Other Ambulatory Visit: Payer: Self-pay

## 2023-01-15 ENCOUNTER — Other Ambulatory Visit (HOSPITAL_COMMUNITY): Payer: Self-pay

## 2023-01-20 ENCOUNTER — Other Ambulatory Visit: Payer: Self-pay

## 2023-01-21 ENCOUNTER — Other Ambulatory Visit: Payer: Self-pay

## 2023-01-27 ENCOUNTER — Other Ambulatory Visit (HOSPITAL_COMMUNITY): Payer: Self-pay

## 2023-01-27 ENCOUNTER — Other Ambulatory Visit: Payer: Self-pay

## 2023-02-01 ENCOUNTER — Other Ambulatory Visit: Payer: Self-pay

## 2023-02-02 ENCOUNTER — Other Ambulatory Visit: Payer: Self-pay

## 2023-04-05 ENCOUNTER — Encounter: Payer: Self-pay | Admitting: Cardiovascular Disease

## 2023-05-03 ENCOUNTER — Other Ambulatory Visit (HOSPITAL_COMMUNITY): Payer: Self-pay

## 2023-05-03 ENCOUNTER — Other Ambulatory Visit: Payer: Self-pay

## 2023-05-04 ENCOUNTER — Other Ambulatory Visit (HOSPITAL_COMMUNITY): Payer: Self-pay

## 2023-06-15 ENCOUNTER — Other Ambulatory Visit: Payer: Self-pay

## 2023-07-27 ENCOUNTER — Other Ambulatory Visit (HOSPITAL_BASED_OUTPATIENT_CLINIC_OR_DEPARTMENT_OTHER): Payer: Self-pay

## 2023-07-27 ENCOUNTER — Other Ambulatory Visit: Payer: Self-pay | Admitting: Cardiovascular Disease

## 2023-07-27 ENCOUNTER — Other Ambulatory Visit (HOSPITAL_COMMUNITY): Payer: Self-pay

## 2023-07-28 ENCOUNTER — Other Ambulatory Visit: Payer: Self-pay

## 2023-07-28 ENCOUNTER — Other Ambulatory Visit (HOSPITAL_COMMUNITY): Payer: Self-pay

## 2023-07-28 MED ORDER — EZETIMIBE 10 MG PO TABS
10.0000 mg | ORAL_TABLET | Freq: Every day | ORAL | 0 refills | Status: DC
  Filled 2023-07-28: qty 90, 90d supply, fill #0

## 2023-07-29 ENCOUNTER — Other Ambulatory Visit: Payer: Self-pay

## 2023-08-10 DIAGNOSIS — Z1389 Encounter for screening for other disorder: Secondary | ICD-10-CM | POA: Diagnosis not present

## 2023-08-10 DIAGNOSIS — E785 Hyperlipidemia, unspecified: Secondary | ICD-10-CM | POA: Diagnosis not present

## 2023-08-10 DIAGNOSIS — R82998 Other abnormal findings in urine: Secondary | ICD-10-CM | POA: Diagnosis not present

## 2023-08-10 LAB — LAB REPORT - SCANNED: EGFR: 92

## 2023-08-17 DIAGNOSIS — Z0000 Encounter for general adult medical examination without abnormal findings: Secondary | ICD-10-CM | POA: Diagnosis not present

## 2023-08-17 DIAGNOSIS — M25552 Pain in left hip: Secondary | ICD-10-CM | POA: Diagnosis not present

## 2023-08-17 DIAGNOSIS — Z860101 Personal history of adenomatous and serrated colon polyps: Secondary | ICD-10-CM | POA: Diagnosis not present

## 2023-08-17 DIAGNOSIS — M545 Low back pain, unspecified: Secondary | ICD-10-CM | POA: Diagnosis not present

## 2023-08-17 DIAGNOSIS — Z23 Encounter for immunization: Secondary | ICD-10-CM | POA: Diagnosis not present

## 2023-08-17 DIAGNOSIS — B351 Tinea unguium: Secondary | ICD-10-CM | POA: Diagnosis not present

## 2023-08-17 DIAGNOSIS — E785 Hyperlipidemia, unspecified: Secondary | ICD-10-CM | POA: Diagnosis not present

## 2023-08-17 DIAGNOSIS — Z1339 Encounter for screening examination for other mental health and behavioral disorders: Secondary | ICD-10-CM | POA: Diagnosis not present

## 2023-08-17 DIAGNOSIS — R634 Abnormal weight loss: Secondary | ICD-10-CM | POA: Diagnosis not present

## 2023-08-17 DIAGNOSIS — Z9861 Coronary angioplasty status: Secondary | ICD-10-CM | POA: Diagnosis not present

## 2023-08-17 DIAGNOSIS — Z1331 Encounter for screening for depression: Secondary | ICD-10-CM | POA: Diagnosis not present

## 2023-08-17 DIAGNOSIS — I2510 Atherosclerotic heart disease of native coronary artery without angina pectoris: Secondary | ICD-10-CM | POA: Diagnosis not present

## 2023-08-31 ENCOUNTER — Ambulatory Visit: Payer: PPO | Attending: Cardiovascular Disease | Admitting: Cardiovascular Disease

## 2023-08-31 ENCOUNTER — Encounter: Payer: Self-pay | Admitting: Cardiovascular Disease

## 2023-08-31 VITALS — BP 118/68 | HR 64 | Ht 76.0 in | Wt 196.4 lb

## 2023-08-31 DIAGNOSIS — I2510 Atherosclerotic heart disease of native coronary artery without angina pectoris: Secondary | ICD-10-CM

## 2023-08-31 DIAGNOSIS — E782 Mixed hyperlipidemia: Secondary | ICD-10-CM | POA: Diagnosis not present

## 2023-08-31 NOTE — Progress Notes (Signed)
Cardiology Office Note:    Date:  08/31/2023   ID:  Jordan [name], DOB 1950-12-01, MRN [id number]  PCP:  Cleatis Polka., MD    [facility] Providers Cardiologist:  Tonny Bollman, MD     Referring MD: Cleatis Polka., MD   Chief Complaint  Patient presents with   Coronary Artery Disease    History of Present Illness:    Jordan [name] is a 72 y.o. male with a hx of  coronary artery disease, presenting for follow-up evaluation.  The patient underwent PCI with a Cypher DES in 2004 when he was found to have subtotal occlusion of his proximal LAD.  He has had no recurrent ischemic events since that time.  He has a known history of false positive EKG response with stress testing.  His last stress test in 2017 showed normal myocardial perfusion.   He is here alone today. Feeling well. Today, he denies symptoms of palpitations, chest pain, shortness of breath, orthopnea, PND, lower extremity edema, dizziness, or syncope. He's riding a recumbent bike on a regular basis with no exertional symptoms. His weight is down 10 pounds from last year. Otherwise no interval changes in his health.   Current Medications: Current Meds  Medication Sig   Acetaminophen (TYLENOL 8 HOUR ARTHRITIS PAIN PO) Take 650 mg by mouth as needed. Per patient when playing golf.   aspirin EC 81 MG tablet Take 160 mg by mouth. 2 tablets daily    ezetimibe (ZETIA) 10 MG tablet Take 1 tablet (10 mg total) by mouth daily.   folic acid (FOLVITE) 400 MCG tablet Take 400 mcg by mouth daily.   ketoconazole (NIZORAL) 2 % shampoo wash scalp 2-3 times a week to scalp. Apply to wet scalp, leave on 2-3 minutes, then rinse   Methyl Salicylate 10 % PTCH Apply topically as needed. Per patient taking 2 patches at one time for hip and back for osteoarthritis.   Multiple Vitamin (MULTIVITAMIN) capsule Take 1 capsule by mouth daily.   nitroGLYCERIN (NITROSTAT) 0.4 MG SL tablet Place 1 tablet (0.4 mg total) under the  tongue every 5 (five) minutes as needed for chest pain.   rosuvastatin (CRESTOR) 40 MG tablet Take 1 tablet (40 mg total) by mouth at bedtime.     Allergies:   Patient has no known allergies.   ROS:   Please see the history of present illness.    Positive for left hip arthritis/pain. All other systems reviewed and are negative.  EKGs/Labs/Other Studies Reviewed:    The following studies were reviewed today: Cardiac Studies & Procedures     STRESS TESTS  MYOCARDIAL PERFUSION IMAGING 04/29/2016  Interpretation Summary  Nuclear stress EF: 59%.  Blood pressure demonstrated a hypertensive response to exercise.  There was 2mm of horizontal ST segment depression in the inferolateral leads at peak exercise. Changes resolved quickly in recovery.  Myocardial perfusion images are normal.  This is an intermediate risk study due to ischemic EKG changes at peak exercise.  The left ventricular ejection fraction is normal (55-65%).              EKG:   EKG Interpretation Date/Time:  Tuesday August 31 2023 10:07:17 EST Ventricular Rate:  63 PR Interval:  144 QRS Duration:  86 QT Interval:  386 QTC Calculation: 395 R Axis:   94  Text Interpretation: Normal sinus rhythm Rightward axis When compared with ECG of 09-Apr-2014 12:21, No significant change was found Confirmed by Tonny Bollman (  [id number]) on 08/31/2023 10:29:13 AM    Recent Labs: 10/29/2022: ALT 33  Recent Lipid Panel    Component Value Date/Time   CHOL 119 10/29/2022 0727   TRIG 69 10/29/2022 0727   HDL 44 10/29/2022 0727   CHOLHDL 2.7 10/29/2022 0727   CHOLHDL 3 05/25/2018 0900   VLDL 16.0 05/25/2018 0900   LDLCALC 61 10/29/2022 0727     Risk Assessment/Calculations:                Physical Exam:    VS:  BP 118/68   Pulse 64   Ht 6\' 4"  (1.93 m)   Wt 196 lb 6.4 oz (89.1 kg)   SpO2 96%   BMI 23.91 kg/m     Wt Readings from Last 3 Encounters:  08/31/23 196 lb 6.4 oz (89.1 kg)  08/10/22 206 lb 9.6 oz  (93.7 kg)  08/04/21 209 lb (94.8 kg)     GEN:  Well nourished, well developed in no acute distress HEENT: Normal NECK: No JVD; No carotid bruits LYMPHATICS: No lymphadenopathy CARDIAC: RRR, no murmurs, rubs, gallops RESPIRATORY:  Clear to auscultation without rales, wheezing or rhonchi  ABDOMEN: Soft, non-tender, non-distended MUSCULOSKELETAL:  No edema; No deformity  SKIN: Warm and dry NEUROLOGIC:  Alert and oriented x 3 PSYCHIATRIC:  Normal affect   Assessment & Plan Coronary artery disease involving native coronary artery of native heart without angina pectoris No angina. Continue ASA, zetia, and rosuvastatin. BP well-controlled. EKG normal. No exertional symptoms.  Mixed hyperlipidemia Recent labs show a cholesterol of 106, LDL 47, HDL 46. Excellent response to the addition of ezetimibe. LFT's are normal. He's following a very healthy diet and regular exercise program.       Medication Adjustments/Labs and Tests Ordered: Current medicines are reviewed at length with the patient today.  Concerns regarding medicines are outlined above.  Orders Placed This Encounter  Procedures   EKG 12-Lead   No orders of the defined types were placed in this encounter.   Patient Instructions  Follow-Up: At [facility], you and your health needs are our priority.  As part of our continuing mission to provide you with exceptional heart care, we have created designated Provider Care Teams.  These Care Teams include your primary Cardiologist (physician) and Advanced Practice Providers (APPs -  Physician Assistants and Nurse Practitioners) who all work together to provide you with the care you need, when you need it.  Your next appointment:   1 year(s)  Provider:   Tonny Bollman, MD        Signed, Tonny Bollman, MD  08/31/2023 11:19 AM    [facility]

## 2023-08-31 NOTE — Patient Instructions (Signed)

## 2023-09-13 ENCOUNTER — Ambulatory Visit: Payer: PPO | Admitting: Cardiovascular Disease

## 2023-10-24 ENCOUNTER — Other Ambulatory Visit: Payer: Self-pay | Admitting: Cardiovascular Disease

## 2023-10-25 ENCOUNTER — Other Ambulatory Visit: Payer: Self-pay

## 2023-10-25 ENCOUNTER — Other Ambulatory Visit (HOSPITAL_COMMUNITY): Payer: Self-pay

## 2023-10-25 MED ORDER — ROSUVASTATIN CALCIUM 40 MG PO TABS
40.0000 mg | ORAL_TABLET | Freq: Every day | ORAL | 3 refills | Status: AC
  Filled 2023-10-25: qty 90, 90d supply, fill #0
  Filled 2024-02-08: qty 90, 90d supply, fill #1
  Filled 2024-05-07: qty 90, 90d supply, fill #2
  Filled 2024-08-04: qty 90, 90d supply, fill #3

## 2023-10-25 MED ORDER — EZETIMIBE 10 MG PO TABS
10.0000 mg | ORAL_TABLET | Freq: Every day | ORAL | 3 refills | Status: AC
  Filled 2023-10-25: qty 90, 90d supply, fill #0
  Filled 2024-02-08: qty 90, 90d supply, fill #1
  Filled 2024-05-07: qty 90, 90d supply, fill #2
  Filled 2024-08-04: qty 90, 90d supply, fill #3

## 2023-11-12 DIAGNOSIS — H5213 Myopia, bilateral: Secondary | ICD-10-CM | POA: Diagnosis not present

## 2023-11-12 DIAGNOSIS — H2513 Age-related nuclear cataract, bilateral: Secondary | ICD-10-CM | POA: Diagnosis not present

## 2023-11-25 ENCOUNTER — Other Ambulatory Visit (HOSPITAL_COMMUNITY): Payer: Self-pay

## 2023-12-15 ENCOUNTER — Other Ambulatory Visit: Payer: Self-pay

## 2023-12-15 ENCOUNTER — Other Ambulatory Visit (HOSPITAL_BASED_OUTPATIENT_CLINIC_OR_DEPARTMENT_OTHER): Payer: Self-pay

## 2023-12-15 ENCOUNTER — Other Ambulatory Visit (HOSPITAL_COMMUNITY): Payer: Self-pay

## 2023-12-15 DIAGNOSIS — L820 Inflamed seborrheic keratosis: Secondary | ICD-10-CM | POA: Diagnosis not present

## 2023-12-15 DIAGNOSIS — L821 Other seborrheic keratosis: Secondary | ICD-10-CM | POA: Diagnosis not present

## 2023-12-15 DIAGNOSIS — L218 Other seborrheic dermatitis: Secondary | ICD-10-CM | POA: Diagnosis not present

## 2023-12-15 DIAGNOSIS — L2989 Other pruritus: Secondary | ICD-10-CM | POA: Diagnosis not present

## 2023-12-15 DIAGNOSIS — L570 Actinic keratosis: Secondary | ICD-10-CM | POA: Diagnosis not present

## 2023-12-15 DIAGNOSIS — D239 Other benign neoplasm of skin, unspecified: Secondary | ICD-10-CM | POA: Diagnosis not present

## 2023-12-15 DIAGNOSIS — Z7189 Other specified counseling: Secondary | ICD-10-CM | POA: Diagnosis not present

## 2023-12-15 DIAGNOSIS — L814 Other melanin hyperpigmentation: Secondary | ICD-10-CM | POA: Diagnosis not present

## 2023-12-15 DIAGNOSIS — L538 Other specified erythematous conditions: Secondary | ICD-10-CM | POA: Diagnosis not present

## 2023-12-15 DIAGNOSIS — R202 Paresthesia of skin: Secondary | ICD-10-CM | POA: Diagnosis not present

## 2023-12-15 MED ORDER — KETOCONAZOLE 2 % EX SHAM
MEDICATED_SHAMPOO | CUTANEOUS | 11 refills | Status: AC
  Filled 2024-05-07: qty 120, 30d supply, fill #0
  Filled 2024-09-27: qty 120, 30d supply, fill #1

## 2024-02-09 ENCOUNTER — Other Ambulatory Visit (HOSPITAL_COMMUNITY): Payer: Self-pay

## 2024-05-08 ENCOUNTER — Other Ambulatory Visit: Payer: Self-pay

## 2024-05-08 ENCOUNTER — Other Ambulatory Visit (HOSPITAL_COMMUNITY): Payer: Self-pay

## 2024-05-14 ENCOUNTER — Other Ambulatory Visit: Payer: Self-pay | Admitting: Cardiovascular Disease

## 2024-05-16 ENCOUNTER — Other Ambulatory Visit: Payer: Self-pay

## 2024-05-16 ENCOUNTER — Other Ambulatory Visit (HOSPITAL_COMMUNITY): Payer: Self-pay

## 2024-05-16 MED ORDER — NITROGLYCERIN 0.4 MG SL SUBL
0.4000 mg | SUBLINGUAL_TABLET | SUBLINGUAL | 4 refills | Status: AC | PRN
  Filled 2024-05-16: qty 25, 1d supply, fill #0

## 2024-06-08 DIAGNOSIS — Z86018 Personal history of other benign neoplasm: Secondary | ICD-10-CM | POA: Diagnosis not present

## 2024-06-08 DIAGNOSIS — I2510 Atherosclerotic heart disease of native coronary artery without angina pectoris: Secondary | ICD-10-CM | POA: Diagnosis not present

## 2024-07-24 ENCOUNTER — Other Ambulatory Visit (HOSPITAL_COMMUNITY): Payer: Self-pay

## 2024-07-25 ENCOUNTER — Other Ambulatory Visit (HOSPITAL_COMMUNITY): Payer: Self-pay

## 2024-07-25 ENCOUNTER — Other Ambulatory Visit: Payer: Self-pay

## 2024-07-25 MED ORDER — BISACODYL 5 MG PO TBEC
DELAYED_RELEASE_TABLET | ORAL | 0 refills | Status: DC
  Filled 2024-07-25: qty 4, 1d supply, fill #0

## 2024-07-25 MED ORDER — NA SULFATE-K SULFATE-MG SULF 17.5-3.13-1.6 GM/177ML PO SOLN
ORAL | 0 refills | Status: DC
  Filled 2024-07-25: qty 354, 2d supply, fill #0

## 2024-08-04 ENCOUNTER — Other Ambulatory Visit: Payer: Self-pay

## 2024-08-04 ENCOUNTER — Other Ambulatory Visit (HOSPITAL_COMMUNITY): Payer: Self-pay

## 2024-08-08 DIAGNOSIS — K5730 Diverticulosis of large intestine without perforation or abscess without bleeding: Secondary | ICD-10-CM | POA: Diagnosis not present

## 2024-08-08 DIAGNOSIS — K649 Unspecified hemorrhoids: Secondary | ICD-10-CM | POA: Diagnosis not present

## 2024-08-08 DIAGNOSIS — Z09 Encounter for follow-up examination after completed treatment for conditions other than malignant neoplasm: Secondary | ICD-10-CM | POA: Diagnosis not present

## 2024-08-08 DIAGNOSIS — Z860101 Personal history of adenomatous and serrated colon polyps: Secondary | ICD-10-CM | POA: Diagnosis not present

## 2024-09-05 DIAGNOSIS — R82998 Other abnormal findings in urine: Secondary | ICD-10-CM | POA: Diagnosis not present

## 2024-09-05 DIAGNOSIS — E7849 Other hyperlipidemia: Secondary | ICD-10-CM | POA: Diagnosis not present

## 2024-09-12 DIAGNOSIS — Z0000 Encounter for general adult medical examination without abnormal findings: Secondary | ICD-10-CM | POA: Diagnosis not present

## 2024-09-12 DIAGNOSIS — M25552 Pain in left hip: Secondary | ICD-10-CM | POA: Diagnosis not present

## 2024-09-12 DIAGNOSIS — M545 Low back pain, unspecified: Secondary | ICD-10-CM | POA: Diagnosis not present

## 2024-09-12 DIAGNOSIS — R634 Abnormal weight loss: Secondary | ICD-10-CM | POA: Diagnosis not present

## 2024-09-12 DIAGNOSIS — M720 Palmar fascial fibromatosis [Dupuytren]: Secondary | ICD-10-CM | POA: Diagnosis not present

## 2024-09-12 DIAGNOSIS — I2510 Atherosclerotic heart disease of native coronary artery without angina pectoris: Secondary | ICD-10-CM | POA: Diagnosis not present

## 2024-09-12 DIAGNOSIS — E785 Hyperlipidemia, unspecified: Secondary | ICD-10-CM | POA: Diagnosis not present

## 2024-09-12 DIAGNOSIS — R972 Elevated prostate specific antigen [PSA]: Secondary | ICD-10-CM | POA: Diagnosis not present

## 2024-09-12 DIAGNOSIS — Z9861 Coronary angioplasty status: Secondary | ICD-10-CM | POA: Diagnosis not present

## 2024-09-12 DIAGNOSIS — D696 Thrombocytopenia, unspecified: Secondary | ICD-10-CM | POA: Diagnosis not present

## 2024-09-12 DIAGNOSIS — Z1331 Encounter for screening for depression: Secondary | ICD-10-CM | POA: Diagnosis not present

## 2024-09-12 DIAGNOSIS — Z1339 Encounter for screening examination for other mental health and behavioral disorders: Secondary | ICD-10-CM | POA: Diagnosis not present

## 2024-09-12 LAB — LAB REPORT - SCANNED: EGFR: 94.8

## 2024-09-15 ENCOUNTER — Ambulatory Visit: Attending: Cardiovascular Disease | Admitting: Cardiovascular Disease

## 2024-09-15 ENCOUNTER — Encounter: Payer: Self-pay | Admitting: Cardiovascular Disease

## 2024-09-15 VITALS — BP 110/60 | HR 69 | Ht 76.0 in | Wt 188.2 lb

## 2024-09-15 DIAGNOSIS — I2510 Atherosclerotic heart disease of native coronary artery without angina pectoris: Secondary | ICD-10-CM

## 2024-09-15 DIAGNOSIS — E782 Mixed hyperlipidemia: Secondary | ICD-10-CM | POA: Diagnosis not present

## 2024-09-15 NOTE — Patient Instructions (Signed)

## 2024-09-15 NOTE — Progress Notes (Signed)
 Cardiology Office Note:    Date:  09/15/2024   ID:  Jordan [name], DOB 07-07-1951, MRN [id number]  PCP:  Loreli Elsie JONETTA Mickey., MD   [facility] Providers Cardiologist:  Ozell Fell, MD     Referring MD: Loreli Elsie JONETTA Mickey., MD   Chief Complaint  Patient presents with   Coronary Artery Disease    History of Present Illness:    Jordan [name] is a 73 y.o. male with a hx of coronary artery disease, presenting for follow-up evaluation.  The patient underwent PCI with a Cypher DES in 2004 when he was found to have subtotal occlusion of his proximal LAD.  He has had no recurrent ischemic events since that time.  He has a known history of false positive EKG response with stress testing.  His last stress test in 2017 showed normal myocardial perfusion.   He brings in labs from Dr [place] office showing cholesterol 106, HDL 47, LDL 49, and trig 49. Creatinine is 0.8, glucose is 96, Apo B is 57.  He has been doing very well.  He remains physically active with no exertional symptoms and specifically denies chest pain, chest pressure, or shortness of breath.  No heart palpitations, lightheadedness, or syncope.  He really watches his diet and focuses on keeping his weight in an ideal range.  Current Medications: Current Meds  Medication Sig   Acetaminophen (TYLENOL 8 HOUR ARTHRITIS PAIN PO) Take 650 mg by mouth as needed. Per patient when playing golf.   aspirin  EC 81 MG tablet Take 160 mg by mouth. 2 tablets daily    Camphor-Menthol-Methyl Sal 3.10-31-08 % PTCH as needed.   ezetimibe  (ZETIA ) 10 MG tablet Take 1 tablet (10 mg total) by mouth daily.   folic acid (FOLVITE) 400 MCG tablet Take 400 mcg by mouth daily.   ketoconazole  (NIZORAL ) 2 % shampoo Wash scalp 2-3 times a week. Apply to wet scalp, leave on 2-3 minutes, then rinse   Methyl Salicylate 10 % PTCH Apply topically as needed. Per patient taking 2 patches at one time for hip and back for osteoarthritis.   Multiple  Vitamin (MULTIVITAMIN) capsule Take 1 capsule by mouth daily.   nitroGLYCERIN  (NITROSTAT ) 0.4 MG SL tablet Place 1 tablet (0.4 mg total) under the tongue every 5 (five) minutes as needed for chest pain.   rosuvastatin  (CRESTOR ) 40 MG tablet Take 1 tablet (40 mg total) by mouth at bedtime.     Allergies:   Patient has no known allergies.   ROS:   Please see the history of present illness.    All other systems reviewed and are negative.  EKGs/Labs/Other Studies Reviewed:    The following studies were reviewed today: Cardiac Studies & Procedures   ______________________________________________________________________________________________   STRESS TESTS  MYOCARDIAL PERFUSION IMAGING 04/29/2016  Interpretation Summary  Nuclear stress EF: 59%.  Blood pressure demonstrated a hypertensive response to exercise.  There was 2mm of horizontal ST segment depression in the inferolateral leads at peak exercise. Changes resolved quickly in recovery.  Myocardial perfusion images are normal.  This is an intermediate risk study due to ischemic EKG changes at peak exercise.  The left ventricular ejection fraction is normal (55-65%).            ______________________________________________________________________________________________      EKG:   EKG Interpretation Date/Time:  Friday September 15 2024 09:31:48 EST Ventricular Rate:  69 PR Interval:  182 QRS Duration:  86 QT Interval:  372 QTC Calculation: 398 R  Axis:   91  Text Interpretation: Sinus rhythm with sinus arrhythmia with occasional Premature ventricular complexes Rightward axis When compared with ECG of 31-Aug-2023 10:07, Premature ventricular complexes are now Present Confirmed by Wonda Sharper [phone]) on 09/15/2024 9:51:20 AM    Recent Labs: No results found for requested labs within last 365 days.  Recent Lipid Panel    Component Value Date/Time   CHOL 119 10/29/2022 0727   TRIG 69 10/29/2022 0727   HDL  44 10/29/2022 0727   CHOLHDL 2.7 10/29/2022 0727   CHOLHDL 3 05/25/2018 0900   VLDL 16.0 05/25/2018 0900   LDLCALC 61 10/29/2022 0727     Risk Assessment/Calculations:                Physical Exam:    VS:  BP 110/60 (BP Location: Left Arm, Patient Position: Sitting, Cuff Size: Normal)   Pulse 69   Ht 6' 4 (1.93 m)   Wt 188 lb 3.2 oz (85.4 kg)   SpO2 97%   BMI 22.91 kg/m     Wt Readings from Last 3 Encounters:  09/15/24 188 lb 3.2 oz (85.4 kg)  08/31/23 196 lb 6.4 oz (89.1 kg)  08/10/22 206 lb 9.6 oz (93.7 kg)     GEN:  Well nourished, well developed in no acute distress HEENT: Normal NECK: No JVD; No carotid bruits LYMPHATICS: No lymphadenopathy CARDIAC: RRR, no murmurs, rubs, gallops RESPIRATORY:  Clear to auscultation without rales, wheezing or rhonchi  ABDOMEN: Soft, non-tender, non-distended MUSCULOSKELETAL:  No edema; No deformity  SKIN: Warm and dry NEUROLOGIC:  Alert and oriented x 3 PSYCHIATRIC:  Normal affect   Assessment & Plan Coronary artery disease involving native coronary artery of native heart without angina pectoris Stable without angina.  Continue aspirin , ezetimibe , and rosuvastatin .  Follow-up 1 year.  EKG with no ischemic changes. Mixed hyperlipidemia Reviewed lipids as above.  LDL is at goal of less than 55.  Continue current management.       Medication Adjustments/Labs and Tests Ordered: Current medicines are reviewed at length with the patient today.  Concerns regarding medicines are outlined above.  Orders Placed This Encounter  Procedures   EKG 12-Lead   No orders of the defined types were placed in this encounter.   Patient Instructions  Medication Instructions:  No medication changes were made at this visit. Continue current regimen.   *If you need a refill on your cardiac medications before your next appointment, please call your pharmacy*  Lab Work: None ordered today. If you have labs (blood work) drawn today and  your tests are completely normal, you will receive your results only by: MyChart Message (if you have MyChart) OR A paper copy in the mail If you have any lab test that is abnormal or we need to change your treatment, we will call you to review the results.  Testing/Procedures: None ordered today.  Follow-Up: At [facility], you and your health needs are our priority.  As part of our continuing mission to provide you with exceptional heart care, our providers are all part of one team.  This team includes your primary Cardiologist (physician) and Advanced Practice Providers or APPs (Physician Assistants and Nurse Practitioners) who all work together to provide you with the care you need, when you need it.  Your next appointment:   1 year(s)  Provider:   Sharper Wonda, MD      Signed, Sharper Wonda, MD  09/15/2024 10:14 AM    [facility]

## 2024-09-26 DIAGNOSIS — Z23 Encounter for immunization: Secondary | ICD-10-CM | POA: Diagnosis not present

## 2024-09-27 ENCOUNTER — Other Ambulatory Visit: Payer: Self-pay
